# Patient Record
Sex: Female | Born: 1935 | Race: White | Hispanic: No | Smoking: Former smoker
Health system: Southern US, Community
[De-identification: ages and names within clinical notes are randomized; demographics above are authoritative.]

## PROBLEM LIST (undated history)

## (undated) DIAGNOSIS — I739 Peripheral vascular disease, unspecified: Secondary | ICD-10-CM

## (undated) DIAGNOSIS — C801 Malignant (primary) neoplasm, unspecified: Secondary | ICD-10-CM

## (undated) DIAGNOSIS — M1990 Unspecified osteoarthritis, unspecified site: Secondary | ICD-10-CM

## (undated) DIAGNOSIS — I509 Heart failure, unspecified: Secondary | ICD-10-CM

## (undated) DIAGNOSIS — M79606 Pain in leg, unspecified: Secondary | ICD-10-CM

## (undated) DIAGNOSIS — I2510 Atherosclerotic heart disease of native coronary artery without angina pectoris: Secondary | ICD-10-CM

## (undated) DIAGNOSIS — J449 Chronic obstructive pulmonary disease, unspecified: Secondary | ICD-10-CM

## (undated) DIAGNOSIS — I499 Cardiac arrhythmia, unspecified: Secondary | ICD-10-CM

## (undated) DIAGNOSIS — I219 Acute myocardial infarction, unspecified: Secondary | ICD-10-CM

## (undated) DIAGNOSIS — Z950 Presence of cardiac pacemaker: Secondary | ICD-10-CM

## (undated) HISTORY — DX: Cardiac arrhythmia, unspecified: I49.9

## (undated) HISTORY — PX: MELANOMA EXCISION: SHX5266

## (undated) HISTORY — PX: CHOLECYSTECTOMY: SHX55

## (undated) HISTORY — DX: Peripheral vascular disease, unspecified: I73.9

## (undated) HISTORY — DX: Malignant (primary) neoplasm, unspecified: C80.1

## (undated) HISTORY — DX: Chronic obstructive pulmonary disease, unspecified: J44.9

## (undated) HISTORY — DX: Unspecified osteoarthritis, unspecified site: M19.90

## (undated) HISTORY — DX: Heart failure, unspecified: I50.9

## (undated) HISTORY — PX: FINGER SURGERY: SHX640

## (undated) HISTORY — DX: Atherosclerotic heart disease of native coronary artery without angina pectoris: I25.10

## (undated) HISTORY — DX: Presence of cardiac pacemaker: Z95.0

## (undated) HISTORY — DX: Acute myocardial infarction, unspecified: I21.9

## (undated) HISTORY — DX: Pain in leg, unspecified: M79.606

---

## 2010-10-06 ENCOUNTER — Other Ambulatory Visit: Payer: Self-pay | Admitting: Lab

## 2010-10-06 DIAGNOSIS — M79609 Pain in unspecified limb: Secondary | ICD-10-CM

## 2010-10-29 ENCOUNTER — Encounter: Payer: Self-pay | Admitting: Vascular Surgery

## 2010-11-03 ENCOUNTER — Encounter: Payer: Self-pay | Admitting: Vascular Surgery

## 2010-11-04 ENCOUNTER — Encounter: Payer: Self-pay | Admitting: Vascular Surgery

## 2010-11-04 ENCOUNTER — Ambulatory Visit (INDEPENDENT_AMBULATORY_CARE_PROVIDER_SITE_OTHER): Payer: Medicare Other | Admitting: Vascular Surgery

## 2010-11-04 VITALS — BP 101/62 | HR 56 | Resp 20 | Ht 66.0 in | Wt 233.0 lb

## 2010-11-04 DIAGNOSIS — I739 Peripheral vascular disease, unspecified: Secondary | ICD-10-CM | POA: Insufficient documentation

## 2010-11-04 DIAGNOSIS — I70229 Atherosclerosis of native arteries of extremities with rest pain, unspecified extremity: Secondary | ICD-10-CM

## 2010-11-04 DIAGNOSIS — M79609 Pain in unspecified limb: Secondary | ICD-10-CM

## 2010-11-04 NOTE — Progress Notes (Signed)
[facility] [place] HISTORY AND PHYSICAL   CC: Referring Physician:  History of Present Illness:  Patient is an 77 year old female referred by Dr. Alona Bene for evaluation of a nonhealing ulceration of the foot. The patient stated that she discussed her toe approximately 2 months ago this will not heal. She has also developed heel ulcer in the last few weeks. She denies rest pain. She denies claudication symptoms. She is minimally ambulatory and really only transfers. She is in a wheelchair most of the time or ambulate somewhat with a walker. Chronic medical problems include diabetes, hypertension, elevated cholesterol, coronary disease, cardiomyopathy, congestive heart failure, COPD, severe osteoarthritis. These are all currently stable and followed by Dr. Ignacia Palma and her cardiologist in [place].   Past Medical History  Diagnosis Date  . Diabetes mellitus   . Cancer     Skin  . Leg pain   . COPD (chronic obstructive pulmonary disease)   . Cardiomyopathy     Past Surgical History  Procedure Date  . Cholecystectomy   . Melanoma excision   . Finger surgery     ROS: [x]  Positive   [ ]  Negative   [ ]  All sytems reviewed and are negative  General:[ ]  Weight loss, [ ]  Fever, [ ]  chills Neurologic: [ ]  Dizziness, [ ]  Blackouts, [ ]  Seizure [ ]  Stroke, [ ]  "Mini stroke", [ ]  Slurred speech, [ ]  Temporary blindness;  [ ] weakness,  [ ]  Hoarseness Cardiac: [ ]  Chest pain/pressure, [ ]  Shortness of breath at rest [x ] Shortness of breath with exertion,  [x ]  Atrial fibrillation or irregular heartbeat Vascular:[ ]  Pain in legs with walking, [ ]  Pain in legs at rest ,[ ]  Pain in legs at night,  [ ]   Non-healing ulcer, [ ]  Blood clot in vein/DVT,   Pulmonary: [x ] Home oxygen, [x ]  Productive cough, [ ]  Coughing up blood,  [ ]  Asthma,  [ ]  Wheezing Musculoskeletal:  [ ]  Arthritis, [ ]  Low back pain,  [ ]  Joint pain Hematologic:[ ]  Easy Bruising, [ ]  Anemia; [ ]   Hepatitis Gastrointestinal: [ ]  Blood in stool,  [ ]  Gastroesophageal Reflux, [ ]  Trouble swallowing Urinary: [ ]  chronic Kidney disease, [ ]  on HD - [ ]  MWF or [ ]  TTHS, [ ]  Burning with urination, [x ] Frequent urination, [ ]  Difficulty urinating;  Skin: [ ]  Rashes, [x ] Wounds Psychological: [ ]  Anxiety,  [ ]  Depression  Social History History  Substance Use Topics  . Smoking status: Former Smoker -- 55 years    Types: Cigarettes    Quit date: 11/04/1999  . Smokeless tobacco: Never Used  . Alcohol Use: No    Family History Family History  Problem Relation Age of Onset  . Diabetes Other   . COPD Mother   . Cancer Father   . Diabetes Sister     Allergies  Allergies  Allergen Reactions  . Ace Inhibitors   . Ciprofloxacin      Current Outpatient Prescriptions  Medication Sig Dispense Refill  . AMITRIPTYLINE HCL PO Take by mouth.        Marland Kitchen aspirin 325 MG EC tablet Take 325 mg by mouth daily.        . Calcium Carbonate-Vitamin D (OSCAL 500/200 D-3 PO) Take by mouth.        Marland Kitchen CARVEDILOL PO Take by mouth.        . CODEINE SULFATE PO Take by  mouth.        . DILTIAZEM HCL PO Take by mouth.        . ergocalciferol (VITAMIN D2) 50000 UNITS capsule Take 50,000 Units by mouth once a week.        . Ezetimibe (ZETIA PO) Take by mouth.        . Fenofibrate (TRICOR PO) Take by mouth.        . FENOFIBRATE PO Take by mouth.        . fish oil-omega-3 fatty acids 1000 MG capsule Take 2 g by mouth daily.        Marland Kitchen FOLIC ACID PO Take by mouth.        . Ginkgo Biloba (GINKGO PO) Take by mouth.        Marland Kitchen GLUCOSAMINE PO Take by mouth.        . GLYBURIDE PO Take by mouth.        . Ipratropium-Albuterol (DUONEB IN) Inhale into the lungs.        . Loratadine (CLARITIN PO) Take by mouth.        . METFORMIN HCL PO Take by mouth.        . Multiple Vitamin (MULTIVITAMIN) capsule Take 1 capsule by mouth daily.        Marland Kitchen NABUMETONE PO Take by mouth.        . Niacin, Antihyperlipidemic, (NIASPAN  PO) Take by mouth.        . Nitroglycerin (MINITRAN TD) Place onto the skin.        Marland Kitchen NITROGLYCERIN PO Take by mouth.        . Olmesartan Medoxomil (BENICAR PO) Take by mouth.        . propoxyphene-acetaminophen (DARVOCET A500) 100-500 MG per tablet Take 1 tablet by mouth every 6 (six) hours as needed.        . simvastatin (ZOCOR) 40 MG tablet Take 40 mg by mouth at bedtime.          Physical Examination  Filed Vitals:   11/04/10 1304  BP: 101/62  Pulse: 56  Resp: 20  Height: 5\' 6"  (1.676 m)  Weight: 233 lb (105.688 kg)    Body mass index is 37.61 kg/(m^2).  General:  Alert and oriented, no acute distress HEENT: Normal Neck: No bruit or JVD Pulmonary: Clear to auscultation bilaterally, distant breath sounds Cardiac: Regular Rate and Rhythm without murmur Gastrointestinal: Soft, non-tender, non-distended, no mass, no scars, obese Skin: No rash, several ulcerations left second toe, 1 cm ulcer pale base left heel Extremity Pulses:  2+ radial, brachial, femoral, dorsalis pedis right, posterior tibial pulses right, absent left pop pedal pulses Musculoskeletal: bilat valgus knee deformity  Neurologic: Upper and lower extremity motor 5/5 and symmetric  DATA:  She had bilateral ABIs performed today which I reviewed and interpreted. ABI on the right side was 0.81 left was 0.37  ASSESSMENT/Plan: Nonhealing ulceration left foot with limb threatening ischemia by ABI. The patient has multiple comorbidities and she is not a very good operative candidate overall. The best option at this point would be arteriogram lower extremity runoff possible angioplasty and stenting. If she is not a candidate for angioplasty and stenting, we will need to consider whether or not she would be an operative candidate and may also need further evaluation by her cardiologist. Risks benefits possible complications and procedure details were explained the patient and her daughter today they understand agree to proceed  her arteriogram will be tomorrow.

## 2010-11-04 NOTE — Progress Notes (Signed)
ABI performed [facility] 11/04/2010

## 2010-11-05 ENCOUNTER — Ambulatory Visit (HOSPITAL_COMMUNITY)
Admission: RE | Admit: 2010-11-05 | Discharge: 2010-11-05 | Disposition: A | Discharge status: Home / Self Care | Payer: Medicare Other | Source: Ambulatory Visit | Attending: Vascular Surgery | Admitting: Vascular Surgery

## 2010-11-05 DIAGNOSIS — I70219 Atherosclerosis of native arteries of extremities with intermittent claudication, unspecified extremity: Secondary | ICD-10-CM

## 2010-11-05 DIAGNOSIS — I708 Atherosclerosis of other arteries: Secondary | ICD-10-CM | POA: Insufficient documentation

## 2010-11-05 DIAGNOSIS — I70209 Unspecified atherosclerosis of native arteries of extremities, unspecified extremity: Secondary | ICD-10-CM | POA: Insufficient documentation

## 2010-11-05 DIAGNOSIS — Z01810 Encounter for preprocedural cardiovascular examination: Secondary | ICD-10-CM | POA: Insufficient documentation

## 2010-11-05 HISTORY — PX: OTHER SURGICAL HISTORY: SHX169

## 2010-11-05 LAB — POCT I-STAT, CHEM 8
BUN: 41 mg/dL — ABNORMAL HIGH (ref 6–23)
Calcium, Ion: 1.28 mmol/L (ref 1.12–1.32)
HCT: 39 % (ref 36.0–46.0)
Sodium: 139 mEq/L (ref 135–145)

## 2010-11-05 LAB — GLUCOSE, CAPILLARY
Glucose-Capillary: 150 mg/dL — ABNORMAL HIGH (ref 70–99)
Glucose-Capillary: 113 mg/dL — ABNORMAL HIGH (ref 70–99)

## 2010-11-05 LAB — POCT ACTIVATED CLOTTING TIME: Activated Clotting Time: 160 seconds

## 2010-11-09 LAB — POCT ACTIVATED CLOTTING TIME: Activated Clotting Time: 188 seconds

## 2010-11-10 NOTE — Op Note (Signed)
Julia Goodman, Julia Goodman                   ACCOUNT NO.:  [id number]  MEDICAL RECORD NO.:  [id number]  LOCATION:  [facility]                         FACILITY:  [facility]  PHYSICIAN:  Janetta Hora. Fields, MD  DATE OF BIRTH:  [birth year]  DATE OF PROCEDURE:  11/05/2010 DATE OF DISCHARGE:  11/05/2010                              OPERATIVE REPORT   PROCEDURE: 1. Aortogram with left lower extremity runoff. 2. Left common iliac stent.  OPERATIVE DETAILS:  After obtaining informed consent, the patient was taken to the operating room.  The patient was placed supine position on angio table.  Both groins were prepped and draped in usual sterile fashion.  Local anesthesia was infiltrated via the right common femoral artery.  Ultrasound was used to identify the femoral bifurcation. Fluoroscopy was also used to identify the femoral head.  Local anesthesia was infiltrated over the area.  The common femoral artery and an introducer needle was used to cannulate the right common femoral artery using ultrasound and fluoroscopic guidance.  The cannulation required a few needle punctures primarily due to the patient's severe obesity.  After cannulation of the right common femoral artery, a 0.035 Versacore wire was threaded up in the abdominal aorta under fluoroscopic guidance.  A 5-French sheath was then placed over the guidewire in the right common femoral artery.  This was thoroughly flushed with heparinized saline.  A 5-French pigtail catheter was then placed over the guidewire and the abdominal aorta and abdominal aortogram was obtained.  This shows bilateral single renal arteries which were patent. The infrarenal abdominal aorta has moderate atherosclerotic change but no focal narrowing.  The right common iliac artery was patent.  The left common iliac artery has a high-grade greater than 90% stenosis in its mid segment.  The left internal iliac artery was patent.  The right internal iliac artery was occluded.  The  right external iliac artery and left external iliac arteries were both patent.  An oblique view of the pelvis was also performed to confirm the left common iliac artery stenosis.  This again confirmed that the right and left external iliac arteries were patent.  However, on the oblique view the right internal iliac artery was visualized so that the left and right internal iliac arteries were patent.  At this point, it was decided to intervene on the left iliac stenosis since the patient's left leg was a symptomatic side.  Local anesthesia was infiltrated over the left common femoral artery and an introducer needle was used to cannulate the left common femoral artery again using fluoroscopic and ultrasound guidance.  An 0.035 Versacore wire was threaded up into the left iliac artery up to the level of the subtotal stenosis.  I then attempted to place a 7-French long bright tip sheath over this.  However, again due to the patient's obesity, the sheath was kinking in the subcutaneous tissues.  Therefore, a 5-French dilator was placed over the guidewire into the left iliac system and the Versacore wire exchanged for an 0.035 Amplatz wire.  I was then able to exchange the 5-French dilator for the 7-French bright tip sheath over the Amplatz wire.  An 0.035 angled Glidewire  was then brought up in the operative field as well as a 5-French Kumpe catheter.  The Kumpe catheter was advanced over the guidewire into the left common iliac artery and using the Kumpe catheter and the Glidewire, I was able to advance the Glidewire across the lesion.  The patient was given 10,000 units of heparin as we were doing this portion of the procedure.  She was given initially a bolus of 7000 units of heparin.  An ACT was checked and found to be just under 200, so the patient was given an additional 3000 units of heparin for total dose of 10,000 units of heparin.  1. At this point, the 5-French Kumpe catheter was  advanced across the     stenosis over the angled Glidewire.  The angled Glidewire was then     removed and exchanged back for the 0.035 Amplatz wire.  I then     proceeded over the Amplatz wire to predilate the 90% stenosis in     the left common iliac artery.  This was done with a 6 x 20     angioplasty balloon and this was inflated to 8 atmospheres of 60     seconds.  The angioplasty balloon was then deflated, pulled back     down again using angiographic and roadmapping guidance as well as     bony landmarks.  A 9 x 59 balloon expandable stent was then brought     up in the operative field and advanced up to the level of the     stenosis.  This was then deployed at the stenosis to 14 atmospheres     at 40 seconds.  Completion arteriogram showed that there was some     poststenotic dilatation of the distal iliac, so in order to have     good stent apposition to the wall distally a 10 x 40 angioplasty     balloon was brought up in the operative field and the distal     segment of the stent was angioplastied to 7 atmospheres to 60     seconds to mold the distal portion of the stent to the wall of the     artery so that there was good wall apposition in the proximal and     distal aspects of the stent.  A completion arteriogram was then     obtained which showed no dissection, extravasation, and good apical     wall apposition of the stent.  At this point, the pigtail catheter     was removed from the right side and a left lower extremity     arteriogram was obtained through the 7-French sheath.  An oblique     view of the left groin was obtained which shows a subtotal     occlusion of the left common femoral artery just above the femoral     bifurcation.  AP view of the runoff then shows a left superficial     femoral artery which has 2 calcified stenoses in its mid segment,     both approximately 50%.  There was a diffuse tapered stenosis of     the distal superficial femoral artery.   Approximately 50% the     popliteal artery was patent above-the-knee but then occludes at the     knee joint.  The tibioperoneal trunk was occluded.  The anterior     tibial artery is in continuity all the way down to the level of the  dorsalis pedis artery.  The peroneal artery is occluded throughout     its course.  The posterior tibial artery was small but does     reconstitute via geniculate collaterals also and continues all the     way to the level of the foot.  Due to the common femoral artery stenosis, the patient was not a candidate for percutaneous intervention of the more distal tibial disease and I did not believe that she was a reasonable candidate.  We tried to recannulize these tibial vessels anyway due to the length of the stenosis.  At this point, the guidewires were all removed and the sheath was thoroughly flushed with heparinized saline to be pulled in the holding area after the patient's ACT was less than 175.  The patient tolerated the procedure well and there were no complications.  The patient was taken to the holding area in stable condition.  OPERATIVE FINDINGS: 1. Greater than 90% left common iliac artery stenosis successfully     stented to 0% stenosis with a 9 x 59 balloon expandable stent. 2. Greater than 90% stenosis, distal left common femoral artery. 3. Multilevel 50% stenosis, left superficial femoral artery. 4. Occlusion of below-knee popliteal artery with reconstitution of the     anterior tibial and posterior tibial arteries.  In light of the arteriographic findings, the patient is not a candidate for more distal peripheral vascular intervention due to the common femoral lesion as well as the length of the lesion in the tibial vessels.  The patient is also not a candidate for a tibial artery bypass overall to her multiple comorbidities, age, and the fact that she is marginally ambulatory at this point.  She currently has an ulceration on her  second toe and on her heel and hopefully these will have reasonable perfusion after treating her iliac stenosis that maybe these will heal spontaneously.  If the wounds continued to deteriorate over time, if the patient develops unrelenting rest pain or serious infection in the foot, then she would need a left above-knee amputation.  The patient will continue to follow up with Dr. Alona Bene for her wound care issues.  If the above mentioned problems happens, she will be referred back to me for possible consideration for an above-knee amputation.  All these findings were discussed with the patient and her family today.     Janetta Hora. Fields, MD     CEF/MEDQ  D:  11/05/2010  T:  11/05/2010  Job:  [id number]  cc:   Jessie Foot  Electronically Signed by Fabienne Bruns MD on 11/10/2010 11:42:08 AM

## 2010-11-17 ENCOUNTER — Encounter: Payer: Self-pay | Admitting: Vascular Surgery

## 2010-11-18 ENCOUNTER — Ambulatory Visit (INDEPENDENT_AMBULATORY_CARE_PROVIDER_SITE_OTHER): Payer: Medicare Other | Admitting: Vascular Surgery

## 2010-11-18 ENCOUNTER — Encounter: Payer: Self-pay | Admitting: Vascular Surgery

## 2010-11-18 VITALS — BP 129/64 | HR 67 | Temp 97.7°F | Resp 24 | Ht 66.0 in | Wt 233.0 lb

## 2010-11-18 DIAGNOSIS — I70219 Atherosclerosis of native arteries of extremities with intermittent claudication, unspecified extremity: Secondary | ICD-10-CM

## 2010-11-18 NOTE — Progress Notes (Signed)
[facility] [place] HISTORY AND PHYSICAL   History of Present Illness:  Patient is a 77 y.o. year old female who presents for follow-up evaluation for PAD with a left foot ulcer.  She is on Aspirin for antiplatelet therapy.  She underwent left common iliac stenting without difficulty but has residual lower extremity occlusions that were not treated to her being a poor operative candidate.  Her atherosclerotic risk factors remain diabetes, elevated cholesterol, hypertension, and coronary artery disease.  These are all currently stabled and followed by his primary care physician.  She is treating the wounds on her toe and heel with aquacel with some improvement.  Past Medical History  Diagnosis Date  . Diabetes mellitus   . Cancer     Skin  . Leg pain   . COPD (chronic obstructive pulmonary disease)   . Cardiomyopathy     Past Surgical History  Procedure Date  . Cholecystectomy   . Melanoma excision   . Finger surgery   . Aortogram with stenting 11-05-10    Left CIA stent    Review of Systems:  Cardiac:denies shortness of breath or chest pain Pulmonary: denies cough or wheeze  Social History History  Substance Use Topics  . Smoking status: Former Smoker -- 55 years    Types: Cigarettes    Quit date: 11/04/1999  . Smokeless tobacco: Never Used  . Alcohol Use: No    Allergies  Allergies  Allergen Reactions  . Ace Inhibitors   . Ciprofloxacin   . Niaspan (Niacin (Antihyperlipidemic))   . Zetia (Ezetimibe)      Current Outpatient Prescriptions  Medication Sig Dispense Refill  . acetaminophen-codeine (TYLENOL #3) 300-30 MG per tablet Take 1 tablet by mouth every 4 (four) hours as needed.        Marland Kitchen AMITRIPTYLINE HCL PO Take 25 mg by mouth daily.       Marland Kitchen aspirin 325 MG EC tablet Take 325 mg by mouth daily.        . Calcium Carbonate-Vitamin D (OSCAL 500/200 D-3 PO) Take by mouth.        Marland Kitchen CARVEDILOL PO Take 6.25 mg by mouth daily.       Marland Kitchen DILTIAZEM  HCL PO Take 120 mg by mouth daily.       . ergocalciferol (VITAMIN D2) 50000 UNITS capsule Take 50,000 Units by mouth once a week.        . Fenofibrate (TRICOR PO) Take 160 mg by mouth daily.       . fish oil-omega-3 fatty acids 1000 MG capsule Take 2 g by mouth daily.        Marland Kitchen FOLIC ACID PO Take by mouth.        . Ginkgo Biloba (GINKGO PO) Take by mouth.        Marland Kitchen GLUCOSAMINE PO Take by mouth.        . GLYBURIDE PO Take 5 mg by mouth.       . Ipratropium-Albuterol (DUONEB IN) Inhale into the lungs.        . Loratadine (CLARITIN PO) Take by mouth.        . METFORMIN HCL PO Take by mouth.        . Multiple Vitamin (MULTIVITAMIN) capsule Take 1 capsule by mouth daily.        Marland Kitchen NABUMETONE PO Take by mouth.        . Nitroglycerin (MINITRAN TD) Place onto the skin.        Marland Kitchen  Olmesartan Medoxomil (BENICAR PO) Take 12.5 mg by mouth.       . simvastatin (ZOCOR) 40 MG tablet Take 40 mg by mouth at bedtime.          Physical Examination  Filed Vitals:   11/18/10 0901  BP: 129/64  Pulse: 67  Temp: 97.7 F (36.5 C)  TempSrc: Oral  Resp: 24  Height: 5\' 6"  (1.676 m)  Weight: 233 lb (105.688 kg)  SpO2: 96%    Body mass index is 37.61 kg/(m^2).  General:  Alert and oriented, no acute distress Extremities: 2+ femoral pulse left side, left foot pink, dry ulcers left second toe, 2 cm left heel crack overall improved   ASSESSMENT: Patient with severe left lower extremity arterial occlusive disease. Her left common iliac stenosis has been treated. The left common iliac stent should improve her overall perfusion to her left lower extremity. However, she has residual tibial and common femoral disease on the left side. Hopefully she will have reasonable perfusion to heal the left foot. If her wound progressed over time her only option would be a left above-knee amputation.   PLAN: She will continue followup with Dr. Linna Caprice.  She will return for ABIs in 6 months.  She will continue her  aspirin.  Fabienne Bruns, MD [facility] [place] Office: [phone] Pager: [phone]

## 2010-11-18 NOTE — Progress Notes (Signed)
Addended by: Sharee Pimple on: 11/18/2010 09:50 AM   Modules accepted: Orders

## 2011-03-11 HISTORY — PX: FRACTURE SURGERY: SHX138

## 2011-05-18 ENCOUNTER — Encounter: Payer: Self-pay | Admitting: Neurosurgery

## 2011-05-19 ENCOUNTER — Encounter: Payer: Self-pay | Admitting: Neurosurgery

## 2011-05-19 ENCOUNTER — Ambulatory Visit (INDEPENDENT_AMBULATORY_CARE_PROVIDER_SITE_OTHER): Payer: Medicare Other | Admitting: Neurosurgery

## 2011-05-19 ENCOUNTER — Encounter (INDEPENDENT_AMBULATORY_CARE_PROVIDER_SITE_OTHER): Payer: Medicare Other | Admitting: *Deleted

## 2011-05-19 VITALS — BP 113/72 | HR 68 | Resp 16 | Ht 66.0 in | Wt 232.0 lb

## 2011-05-19 DIAGNOSIS — I70219 Atherosclerosis of native arteries of extremities with intermittent claudication, unspecified extremity: Secondary | ICD-10-CM

## 2011-05-19 DIAGNOSIS — I739 Peripheral vascular disease, unspecified: Secondary | ICD-10-CM

## 2011-05-19 DIAGNOSIS — Z48812 Encounter for surgical aftercare following surgery on the circulatory system: Secondary | ICD-10-CM

## 2011-05-19 NOTE — Progress Notes (Signed)
Addended by: Sharee Pimple on: 05/19/2011 03:18 PM   Modules accepted: Orders

## 2011-05-19 NOTE — Progress Notes (Signed)
[facility] [place] HISTORY AND PHYSICAL   CC: Daniel lower extremity ABI status post a left CIA stent September 2012 Referring Physician: Darrick Penna  History of Present Illness: This is a 78 year old female patient of Dr. Darrick Penna that is now almost 7 months status post left CIA stent to the patient is seen with her daughter who states her left lower extremity ulcers have healed. Her left foot has healed. She has no new medical problems other than a fractured coccyx.  Past Medical History  Diagnosis Date  . Diabetes mellitus   . Leg pain   . COPD (chronic obstructive pulmonary disease)   . Cardiomyopathy   . Irregular heartbeat   . Coronary artery disease   . Myocardial infarction   . Peripheral vascular disease   . Cancer     Skin Melanoma    ROS: [x]  Positive   [ ]  Denies    General: [ ]  Weight loss, [ ]  Fever, [ ]  chills Neurologic: [ ]  Dizziness, [ ]  Blackouts, [ ]  Seizure [ ]  Stroke, [ ]  "Mini stroke", [ ]  Slurred speech, [ ]  Temporary blindness; [ ]  weakness in arms or legs, [ ]  Hoarseness Cardiac: [ ]  Chest pain/pressure, [ ]  Shortness of breath at rest [ ]  Shortness of breath with exertion, [ ]  Atrial fibrillation or irregular heartbeat Vascular: [ ]  Pain in legs with walking, [ ]  Pain in legs at rest, [ ]  Pain in legs at night,  [ ]  Non-healing ulcer, [ ]  Blood clot in vein/DVT,   Pulmonary: [ ]  Home oxygen, [ ]  Productive cough, [ ]  Coughing up blood, [ ]  Asthma,  [ ]  Wheezing Musculoskeletal:  [ ]  Arthritis, [ ]  Low back pain, [ ]  Joint pain Hematologic: [ ]  Easy Bruising, [ ]  Anemia; [ ]  Hepatitis Gastrointestinal: [ ]  Blood in stool, [ ]  Gastroesophageal Reflux/heartburn, [ ]  Trouble swallowing Urinary: [ ]  chronic Kidney disease, [ ]  on HD - [ ]  MWF or [ ]  TTHS, [ ]  Burning with urination, [ ]  Difficulty urinating Skin: [ ]  Rashes, [ ]  Wounds Psychological: [ ]  Anxiety, [ ]  Depression   Social History History  Substance Use Topics  . Smoking  status: Former Smoker -- 55 years    Types: Cigarettes    Quit date: 11/04/1999  . Smokeless tobacco: Never Used  . Alcohol Use: No    Family History Family History  Problem Relation Age of Onset  . Diabetes Other   . COPD Mother   . Cancer Father   . Diabetes Sister   . Hyperlipidemia Daughter   . Hypertension Daughter   . Hyperlipidemia Son   . Hypertension Son     Allergies  Allergen Reactions  . Hydrocodone Anaphylaxis  . Ace Inhibitors   . Ciprofloxacin   . Niaspan (Niacin (Antihyperlipidemic))   . Zetia (Ezetimibe)     Current Outpatient Prescriptions  Medication Sig Dispense Refill  . acetaminophen-codeine (TYLENOL #3) 300-30 MG per tablet Take 1 tablet by mouth every 4 (four) hours as needed.        Marland Kitchen AMITRIPTYLINE HCL PO Take 25 mg by mouth daily.       Marland Kitchen aspirin 325 MG EC tablet Take 325 mg by mouth daily.        . Calcium Carbonate-Vitamin D (OSCAL 500/200 D-3 PO) Take by mouth.        Marland Kitchen CARVEDILOL PO Take 6.25 mg by mouth daily.       Marland Kitchen DILTIAZEM  HCL PO Take 120 mg by mouth daily.       . ergocalciferol (VITAMIN D2) 50000 UNITS capsule Take 50,000 Units by mouth once a week.        . Fenofibrate (TRICOR PO) Take 160 mg by mouth daily.       . fish oil-omega-3 fatty acids 1000 MG capsule Take 2 g by mouth daily.        Marland Kitchen FOLIC ACID PO Take by mouth.        . Ginkgo Biloba (GINKGO PO) Take by mouth.        Marland Kitchen GLUCOSAMINE PO Take by mouth.        . GLYBURIDE PO Take 5 mg by mouth.       . Ipratropium-Albuterol (DUONEB IN) Inhale into the lungs.        . Loratadine (CLARITIN PO) Take by mouth.        . METFORMIN HCL PO Take by mouth.        . Multiple Vitamin (MULTIVITAMIN) capsule Take 1 capsule by mouth daily.        Marland Kitchen NABUMETONE PO Take by mouth.        . Nitroglycerin (MINITRAN TD) Place onto the skin.        . Olmesartan Medoxomil (BENICAR PO) Take 12.5 mg by mouth.       . pravastatin (PRAVACHOL) 80 MG tablet Take 80 mg by mouth at bedtime.      .  simvastatin (ZOCOR) 40 MG tablet Take 40 mg by mouth at bedtime.          Physical Examination  Filed Vitals:   05/19/11 0947  BP: 113/72  Pulse: 68  Resp: 16    Body mass index is 37.45 kg/(m^2).  General:  WDWN in NAD Gait: Normal HEENT: WNL Eyes: Pupils equal Pulmonary: normal non-labored breathing , without Rales, rhonchi,  wheezing Cardiac: RRR, without  Murmurs, rubs or gallops; No carotid bruits Abdomen: soft, NT, no masses Skin: no rashes, ulcers noted Vascular Exam/Pulses: She has 2+ radial pulses bilaterally, palpable femoral pulses bilaterally, she has a 2+ DP and PT on the right, DP and PT are heard with Doppler on the left  Extremities without ischemic changes, no Gangrene , no cellulitis; no open wounds;  Musculoskeletal: no muscle wasting or atrophy  Neurologic: A&O X 3; Appropriate Affect ; SENSATION: normal; MOTOR FUNCTION:  moving all extremities equally. Speech is fluent/normal  Non-Invasive Vascular Imaging: An ABI today is 0.92 on the right 0.62 on the left  ASSESSMENT/PLAN: I discussed the ABIs with Dr. Darrick Penna who is pleased with the patient's results thus far, the patient may return for repeat ABIs in one year, she and her daughter's questions were encouraged and answered.  Lauree Chandler ANP  Clinic M.D.: [place]

## 2012-05-24 ENCOUNTER — Ambulatory Visit: Payer: Medicare Other | Admitting: Neurosurgery

## 2012-05-24 ENCOUNTER — Encounter (INDEPENDENT_AMBULATORY_CARE_PROVIDER_SITE_OTHER): Payer: Medicare Other | Admitting: *Deleted

## 2012-05-24 DIAGNOSIS — I70219 Atherosclerosis of native arteries of extremities with intermittent claudication, unspecified extremity: Secondary | ICD-10-CM

## 2012-05-28 ENCOUNTER — Other Ambulatory Visit: Payer: Self-pay | Admitting: *Deleted

## 2012-05-28 ENCOUNTER — Encounter: Payer: Self-pay | Admitting: Vascular Surgery

## 2012-05-28 DIAGNOSIS — I70219 Atherosclerosis of native arteries of extremities with intermittent claudication, unspecified extremity: Secondary | ICD-10-CM

## 2013-05-29 ENCOUNTER — Encounter: Payer: Self-pay | Admitting: Vascular Surgery

## 2013-05-30 ENCOUNTER — Ambulatory Visit (INDEPENDENT_AMBULATORY_CARE_PROVIDER_SITE_OTHER): Payer: Medicare Other | Admitting: Vascular Surgery

## 2013-05-30 ENCOUNTER — Ambulatory Visit (HOSPITAL_COMMUNITY)
Admission: RE | Admit: 2013-05-30 | Discharge: 2013-05-30 | Disposition: A | Discharge status: Home / Self Care | Payer: Medicare Other | Source: Ambulatory Visit | Attending: Vascular Surgery | Admitting: Vascular Surgery

## 2013-05-30 ENCOUNTER — Encounter: Payer: Self-pay | Admitting: Vascular Surgery

## 2013-05-30 VITALS — BP 99/59 | HR 46 | Resp 16 | Ht 65.0 in | Wt 212.0 lb

## 2013-05-30 DIAGNOSIS — I70219 Atherosclerosis of native arteries of extremities with intermittent claudication, unspecified extremity: Secondary | ICD-10-CM

## 2013-05-30 NOTE — Progress Notes (Signed)
[facility] [place] HISTORY AND PHYSICAL   CC:  Non healing ulcer  Julia Cedars, MD  HPI: This is a 80 y.o. female who presents today for non healing ulcers on her left foot.  She states that she has a desk chair that the legs stick out and she has "clumbsy" feet and bumps into the chair.  She now has an ulcer over the achilles and lateral malleolus.  She denies any new problems.  Past Medical History  Diagnosis Date  . Diabetes mellitus   . Leg pain   . COPD (chronic obstructive pulmonary disease)   . Cardiomyopathy   . Irregular heartbeat   . Coronary artery disease   . Myocardial infarction   . Peripheral vascular disease   . Cancer     Skin Melanoma  . CHF (congestive heart failure)   . Arthritis     OSTEOARTHRITIS   Past Surgical History  Procedure Laterality Date  . Melanoma excision    . Finger surgery    . Aortogram with stenting  11-05-10    Left CIA stent  . Fracture surgery  Feb. 2013    Spine  . Cholecystectomy      Gall Bladder    Allergies  Allergen Reactions  . Hydrocodone Anaphylaxis  . Ace Inhibitors   . Ciprofloxacin   . Niaspan [Niacin Er]   . Zetia [Ezetimibe]     Current Outpatient Prescriptions  Medication Sig Dispense Refill  . acetaminophen-codeine (TYLENOL #3) 300-30 MG per tablet Take 1 tablet by mouth every 4 (four) hours as needed.        Marland Kitchen AMITRIPTYLINE HCL PO Take 25 mg by mouth daily.       Marland Kitchen apixaban (ELIQUIS) 2.5 MG TABS tablet Take 2.5 mg by mouth 2 (two) times daily.      . Calcium Carbonate-Vitamin D (OSCAL 500/200 D-3 PO) Take by mouth.        . ergocalciferol (VITAMIN D2) 50000 UNITS capsule Take 50,000 Units by mouth once a week.        . Fenofibrate (TRICOR PO) Take 160 mg by mouth daily.       . fish oil-omega-3 fatty acids 1000 MG capsule Take 2 g by mouth daily.        Marland Kitchen FOLIC ACID PO Take by mouth.        . FUROSEMIDE PO Take by mouth daily.      . Ginkgo Biloba (GINKGO PO) Take by mouth.         Marland Kitchen glipiZIDE (GLUCOTROL) 5 MG tablet Take 5 mg by mouth 2 (two) times daily before a meal. Takes 2 tablets twice daily.      Marland Kitchen GLUCOSAMINE PO Take by mouth.        . METFORMIN HCL PO Take by mouth.        . Multiple Vitamin (MULTIVITAMIN) capsule Take 1 capsule by mouth daily.        Marland Kitchen NABUMETONE PO Take by mouth.        . Nitroglycerin (MINITRAN TD) Place onto the skin.        . Olmesartan Medoxomil (BENICAR PO) Take 12.5 mg by mouth.       . pravastatin (PRAVACHOL) 80 MG tablet Take 80 mg by mouth at bedtime.      . sotalol (BETAPACE) 80 MG tablet Take 80 mg by mouth 2 (two) times daily.      Marland Kitchen aspirin 325 MG EC tablet Take 325 mg  by mouth daily.        Marland Kitchen CARVEDILOL PO Take 6.25 mg by mouth daily.       Marland Kitchen DILTIAZEM HCL PO Take 120 mg by mouth daily.       . GLYBURIDE PO Take 5 mg by mouth.       . Ipratropium-Albuterol (DUONEB IN) Inhale into the lungs.        . Loratadine (CLARITIN PO) Take by mouth.        . simvastatin (ZOCOR) 40 MG tablet Take 40 mg by mouth at bedtime.         No current facility-administered medications for this visit.    Family History  Problem Relation Age of Onset  . Diabetes Other   . COPD Mother   . Cancer Father   . Diabetes Sister   . Hyperlipidemia Daughter   . Hypertension Daughter   . Hyperlipidemia Son   . Hypertension Son     History   Social History  . Marital Status: Widowed    Spouse Name: N/A    Number of Children: N/A  . Years of Education: N/A   Occupational History  . Not on file.   Social History Main Topics  . Smoking status: Former Smoker -- 32 years    Types: Cigarettes    Quit date: 11/04/1999  . Smokeless tobacco: Never Used  . Alcohol Use: No  . Drug Use: No  . Sexual Activity: Not on file   Other Topics Concern  . Not on file   Social History Narrative  . No narrative on file     ROS: [x]  Positive   [ ]  Negative   [ ]  All sytems reviewed and are negative  Cardiovascular: []  chest pain/pressure []   palpitations []  SOB lying flat [x]  DOE-COPD [X]  pain in legs while walking-Osteoarthritis  []  pain in feet when lying flat []  hx of DVT []  hx of phlebitis []  swelling in legs []  varicose veins  Pulmonary: []  productive cough []  asthma []  wheezing  Neurologic: [x]  weakness in []  arms [x]  legs [x]  numbness in []  arms [x]  legs [] difficulty speaking or slurred speech []  temporary loss of vision in one eye []  dizziness  Hematologic: []  bleeding problems []  problems with blood clotting easily  GI []  vomiting blood []  blood in stool  GU: []  burning with urination []  blood in urine  Psychiatric: []  hx of major depression  Integumentary: []  rashes []  ulcers  Constitutional: []  fever []  chills   PHYSICAL EXAMINATION:  Filed Vitals:   05/30/13 1354  BP: 99/59  Pulse: 46  Resp: 16   Body mass index is 35.28 kg/(m^2).  General:  WDWN in NAD Gait: unsteady HENT: WNL, normocephalic Eyes: Pupils equal Pulmonary: normal non-labored breathing , without Rales, rhonchi,  wheezing Cardiac: RRR, without  Murmurs, rubs or gallops;  Abdomen: soft, NT, no masses Skin: without rashes, with ulcers  Vascular Exam/Pulses:1+ bilateral femoral pulses Extremities: without ischemic changes, without Gangrene , without cellulitis; with open wounds;  21mm ulcer at the achilles 58mm ulcer on the lateral malleolus Distal pulses are not palplable  Musculoskeletal: no muscle wasting or atrophy  Neurologic: A&O X 3; Appropriate Affect ; SENSATION: normal; MOTOR FUNCTION:  moving all extremities equally. Speech is fluent/normal   Non-Invasive Vascular Imaging:  ABI's 05/30/13 Right:  0.64 Left:  0.47  Pt meds includes: Statin:  yes Beta Blocker:  yes Aspirin:  yes ACEI:  no ARB:  no Other Antiplatelet/Anticoagulant:  yes-Eliquis  ASSESSMENT: 80 y.o. female with new ulcers on left achilles and left lateral malleolus.  Pt and family member state these have significantly  improved.  PLAN: -will have pt to continue to wash daily with soap and water and then apply silvadene and bandage.  She will f/u with Dr. Oneida Alar in 3-4 weeks for a wound check.   -She is advised to put padding on the chair legs or do away with the chair as her foot bumps the feet of the chair and she is at high risk for non healing wounds and subsequent amputation.    Leontine Locket, PA-C [facility] [phone]  Clinic MD:  Pt seen and examined in conjunction with Dr. Oneida Alar  Multiple ulcerations Left leg all less than 1 mm in depth. The patient reports that these are slowly healing. ABIs today were 0.47 on the left 0.64 on the right with monophasic waveforms. She does have a palpable femoral pulse suggesting that her left common iliac stent is patent  She will followup in a few weeks and if the wound on her foot have not healed spontaneously consider arteriogram at that point. She overall is fairly debilitated and not a very good revascularization candidate.  Ruta Hinds, MD [facility] [place] Office: [phone] Pager: [phone]

## 2013-06-27 ENCOUNTER — Ambulatory Visit: Payer: Medicare Other | Admitting: Vascular Surgery

## 2013-07-10 ENCOUNTER — Encounter: Payer: Self-pay | Admitting: Vascular Surgery

## 2013-07-11 ENCOUNTER — Encounter: Payer: Self-pay | Admitting: Vascular Surgery

## 2013-07-11 ENCOUNTER — Ambulatory Visit (INDEPENDENT_AMBULATORY_CARE_PROVIDER_SITE_OTHER): Payer: Medicare Other | Admitting: Vascular Surgery

## 2013-07-11 VITALS — BP 119/76 | HR 53 | Ht 65.0 in | Wt 212.0 lb

## 2013-07-11 DIAGNOSIS — I739 Peripheral vascular disease, unspecified: Secondary | ICD-10-CM

## 2013-07-11 DIAGNOSIS — L98499 Non-pressure chronic ulcer of skin of other sites with unspecified severity: Principal | ICD-10-CM

## 2013-07-11 DIAGNOSIS — I7025 Atherosclerosis of native arteries of other extremities with ulceration: Secondary | ICD-10-CM | POA: Insufficient documentation

## 2013-07-11 NOTE — Progress Notes (Signed)
[facility] [place] HISTORY AND PHYSICAL   CC:  Non healing ulcer   Netta Cedars, MD  HPI: This is a 80 y.o. female who presents today for non healing ulcers on her left foot.  she was last seen one month ago. She states that she has a desk chair that the legs stick out and she has "clumbsy" feet and bumps into the chair.  she states she also occasionally bumps her foot against her walker. She now has an ulcer over the lateral malleolus and dorsal to.  She denies any new problems.    Past Medical History   Diagnosis  Date   .  Diabetes mellitus     .  Leg pain     .  COPD (chronic obstructive pulmonary disease)     .  Cardiomyopathy     .  Irregular heartbeat     .  Coronary artery disease     .  Myocardial infarction     .  Peripheral vascular disease     .  Cancer         Skin Melanoma   .  CHF (congestive heart failure)     .  Arthritis         OSTEOARTHRITIS      Past Surgical History   Procedure  Laterality  Date   .  Melanoma excision       .  Finger surgery       .  Aortogram with stenting    11-05-10       Left CIA stent   .  Fracture surgery    Feb. 2013       Spine   .  Cholecystectomy           Gall Bladder       Allergies   Allergen  Reactions   .  Hydrocodone  Anaphylaxis   .  Ace Inhibitors     .  Ciprofloxacin     .  Niaspan [Niacin Er]     .  Zetia [Ezetimibe]         Current Outpatient Prescriptions   Medication  Sig  Dispense  Refill   .  acetaminophen-codeine (TYLENOL #3) 300-30 MG per tablet  Take 1 tablet by mouth every 4 (four) hours as needed.           Marland Kitchen  AMITRIPTYLINE HCL PO  Take 25 mg by mouth daily.          Marland Kitchen  apixaban (ELIQUIS) 2.5 MG TABS tablet  Take 2.5 mg by mouth 2 (two) times daily.         .  Calcium Carbonate-Vitamin D (OSCAL 500/200 D-3 PO)  Take by mouth.           .  ergocalciferol (VITAMIN D2) 50000 UNITS capsule  Take 50,000 Units by mouth once a week.           .  Fenofibrate (TRICOR PO)  Take 160  mg by mouth daily.          .  fish oil-omega-3 fatty acids 1000 MG capsule  Take 2 g by mouth daily.           Marland Kitchen  FOLIC ACID PO  Take by mouth.           .  FUROSEMIDE PO  Take by mouth daily.         .  Ginkgo Biloba (GINKGO PO)  Take  by mouth.           Marland Kitchen  glipiZIDE (GLUCOTROL) 5 MG tablet  Take 5 mg by mouth 2 (two) times daily before a meal. Takes 2 tablets twice daily.         Marland Kitchen  GLUCOSAMINE PO  Take by mouth.           .  METFORMIN HCL PO  Take by mouth.           .  Multiple Vitamin (MULTIVITAMIN) capsule  Take 1 capsule by mouth daily.           Marland Kitchen  NABUMETONE PO  Take by mouth.           .  Nitroglycerin (MINITRAN TD)  Place onto the skin.           .  Olmesartan Medoxomil (BENICAR PO)  Take 12.5 mg by mouth.          .  pravastatin (PRAVACHOL) 80 MG tablet  Take 80 mg by mouth at bedtime.         .  sotalol (BETAPACE) 80 MG tablet  Take 80 mg by mouth 2 (two) times daily.         Marland Kitchen  aspirin 325 MG EC tablet  Take 325 mg by mouth daily.           Marland Kitchen  CARVEDILOL PO  Take 6.25 mg by mouth daily.          Marland Kitchen  DILTIAZEM HCL PO  Take 120 mg by mouth daily.          .  GLYBURIDE PO  Take 5 mg by mouth.          .  Ipratropium-Albuterol (DUONEB IN)  Inhale into the lungs.           .  Loratadine (CLARITIN PO)  Take by mouth.           .  simvastatin (ZOCOR) 40 MG tablet  Take 40 mg by mouth at bedtime.              No current facility-administered medications for this visit.       Family History   Problem  Relation  Age of Onset   .  Diabetes  Other     .  COPD  Mother     .  Cancer  Father     .  Diabetes  Sister     .  Hyperlipidemia  Daughter     .  Hypertension  Daughter     .  Hyperlipidemia  Son     .  Hypertension  Son         History      Social History   .  Marital Status:  Widowed       Spouse Name:  N/A       Number of Children:  N/A   .  Years of Education:  N/A      Occupational History   .  Not on file.      Social History Main Topics   .  Smoking  status:  Former Smoker -- 49 years       Types:  Cigarettes       Quit date:  11/04/1999   .  Smokeless tobacco:  Never Used   .  Alcohol Use:  No   .  Drug Use:  No   .  Sexual Activity:  Not on  file      Other Topics  Concern   .  Not on file      Social History Narrative   .  No narrative on file      ROS: [x]  Positive   [ ]  Negative   [ ]  All sytems reviewed and are negative  Cardiovascular: []  chest pain/pressure []  palpitations []  SOB lying flat [x]  DOE-COPD [X]  pain in legs while walking-Osteoarthritis   []  pain in feet when lying flat []  hx of DVT []  hx of phlebitis []  swelling in legs []  varicose veins  Pulmonary: []  productive cough []  asthma []  wheezing  Neurologic: [x]  weakness in []  arms [x]  legs [x]  numbness in []  arms [x]  legs [] difficulty speaking or slurred speech []  temporary loss of vision in one eye []  dizziness  Hematologic: []  bleeding problems []  problems with blood clotting easily  GI []  vomiting blood []  blood in stool  GU: []  burning with urination []  blood in urine  Psychiatric: []  hx of major depression  Integumentary: []  rashes []  ulcers  Constitutional: []  fever []  chills   PHYSICAL EXAMINATION:  Filed Vitals:   07/11/13 1003  BP: 119/76  Pulse: 53  Height: 5\' 5"  (1.651 m)  Weight: 212 lb (96.163 kg)  SpO2: 90%     Body mass index is 35.28 kg/(m^2).  General:  WDWN in NAD Gait: unsteady HENT: WNL, normocephalic Abdomen: soft, NT, no masses, obese Skin: without rashes, left second toe dorsal ulcers 2 cm x 1 cm diameter less than 1 mm depth, left lateral malleolus ulcer 1 cm diameter less than 2 mm depth  Vascular Exam/Pulses:1+ bilateral femoral pulses Extremities: without ischemic changes, without Gangrene , without cellulitis Distal pulses are not palplable   Musculoskeletal: no muscle wasting or atrophy       Neurologic: A&O X 3; Appropriate Affect ; SENSATION: normal; MOTOR FUNCTION:  moving all  extremities equally. Speech is fluent/normal   Non-Invasive Vascular Imaging:  ABI's 05/30/13 Right:  0.64 Left:  0.47  Pt meds includes: Statin:  yes Beta Blocker:  yes Aspirin:  yes ACEI:  no ARB:  no Other Antiplatelet/Anticoagulant:  yes-Eliquis   ASSESSMENT: 80 y.o. female with chronic ulcers on left dorsal second toe and left lateral malleolus.  Pt and family member state these have significantly improved.  PLAN: -will have pt to continue to wash daily with soap and water and then apply silvadene and bandage.  She will f/u  in 3-4 weeks for a wound check.    -She is advised to put padding on the chair legs or do away with the chair as her foot bumps the feet of the chair and she is at high risk for non healing wounds and subsequent amputation. She was also advised to pad her walker an area that was abrading her leg  She will followup in a few weeks and if the wound on her foot have not healed spontaneously consider arteriogram at that point. She overall is fairly debilitated and not a very good revascularization candidate.  Ruta Hinds, MD [facility] [place] Office: [phone] Pager: [phone]

## 2013-08-26 ENCOUNTER — Encounter: Payer: Self-pay | Admitting: Vascular Surgery

## 2013-08-27 ENCOUNTER — Ambulatory Visit: Payer: Medicare Other | Admitting: Vascular Surgery

## 2013-08-29 ENCOUNTER — Ambulatory Visit: Payer: Medicare Other | Admitting: Vascular Surgery

## 2014-04-17 ENCOUNTER — Emergency Department (HOSPITAL_COMMUNITY): Payer: Medicare Other

## 2014-04-17 ENCOUNTER — Inpatient Hospital Stay (HOSPITAL_COMMUNITY)
Admission: EM | Admit: 2014-04-17 | Discharge: 2014-05-01 | DRG: 253 | Disposition: A | Discharge status: Skilled Nursing Facility | Payer: Medicare Other | Attending: Vascular Surgery | Admitting: Vascular Surgery

## 2014-04-17 ENCOUNTER — Encounter (HOSPITAL_COMMUNITY): Payer: Self-pay

## 2014-04-17 DIAGNOSIS — Z825 Family history of asthma and other chronic lower respiratory diseases: Secondary | ICD-10-CM

## 2014-04-17 DIAGNOSIS — I5042 Chronic combined systolic (congestive) and diastolic (congestive) heart failure: Secondary | ICD-10-CM

## 2014-04-17 DIAGNOSIS — Z9981 Dependence on supplemental oxygen: Secondary | ICD-10-CM

## 2014-04-17 DIAGNOSIS — R05 Cough: Secondary | ICD-10-CM

## 2014-04-17 DIAGNOSIS — E119 Type 2 diabetes mellitus without complications: Secondary | ICD-10-CM

## 2014-04-17 DIAGNOSIS — Z7901 Long term (current) use of anticoagulants: Secondary | ICD-10-CM

## 2014-04-17 DIAGNOSIS — Z01818 Encounter for other preprocedural examination: Secondary | ICD-10-CM

## 2014-04-17 DIAGNOSIS — L03031 Cellulitis of right toe: Secondary | ICD-10-CM | POA: Diagnosis not present

## 2014-04-17 DIAGNOSIS — R918 Other nonspecific abnormal finding of lung field: Secondary | ICD-10-CM | POA: Diagnosis present

## 2014-04-17 DIAGNOSIS — I2582 Chronic total occlusion of coronary artery: Secondary | ICD-10-CM | POA: Diagnosis present

## 2014-04-17 DIAGNOSIS — R32 Unspecified urinary incontinence: Secondary | ICD-10-CM | POA: Diagnosis present

## 2014-04-17 DIAGNOSIS — L97529 Non-pressure chronic ulcer of other part of left foot with unspecified severity: Secondary | ICD-10-CM | POA: Diagnosis present

## 2014-04-17 DIAGNOSIS — R0600 Dyspnea, unspecified: Secondary | ICD-10-CM

## 2014-04-17 DIAGNOSIS — I2510 Atherosclerotic heart disease of native coronary artery without angina pectoris: Secondary | ICD-10-CM

## 2014-04-17 DIAGNOSIS — Z833 Family history of diabetes mellitus: Secondary | ICD-10-CM

## 2014-04-17 DIAGNOSIS — E1152 Type 2 diabetes mellitus with diabetic peripheral angiopathy with gangrene: Principal | ICD-10-CM | POA: Diagnosis present

## 2014-04-17 DIAGNOSIS — Z87891 Personal history of nicotine dependence: Secondary | ICD-10-CM

## 2014-04-17 DIAGNOSIS — Z85820 Personal history of malignant melanoma of skin: Secondary | ICD-10-CM

## 2014-04-17 DIAGNOSIS — N183 Chronic kidney disease, stage 3 unspecified: Secondary | ICD-10-CM | POA: Diagnosis present

## 2014-04-17 DIAGNOSIS — I708 Atherosclerosis of other arteries: Secondary | ICD-10-CM | POA: Diagnosis present

## 2014-04-17 DIAGNOSIS — R059 Cough, unspecified: Secondary | ICD-10-CM

## 2014-04-17 DIAGNOSIS — J449 Chronic obstructive pulmonary disease, unspecified: Secondary | ICD-10-CM | POA: Diagnosis present

## 2014-04-17 DIAGNOSIS — I429 Cardiomyopathy, unspecified: Secondary | ICD-10-CM | POA: Diagnosis present

## 2014-04-17 DIAGNOSIS — E1165 Type 2 diabetes mellitus with hyperglycemia: Secondary | ICD-10-CM | POA: Diagnosis present

## 2014-04-17 DIAGNOSIS — I4891 Unspecified atrial fibrillation: Secondary | ICD-10-CM | POA: Diagnosis present

## 2014-04-17 DIAGNOSIS — I25119 Atherosclerotic heart disease of native coronary artery with unspecified angina pectoris: Secondary | ICD-10-CM | POA: Diagnosis not present

## 2014-04-17 DIAGNOSIS — I739 Peripheral vascular disease, unspecified: Secondary | ICD-10-CM | POA: Diagnosis present

## 2014-04-17 DIAGNOSIS — I480 Paroxysmal atrial fibrillation: Secondary | ICD-10-CM | POA: Diagnosis present

## 2014-04-17 DIAGNOSIS — Z79899 Other long term (current) drug therapy: Secondary | ICD-10-CM

## 2014-04-17 DIAGNOSIS — N184 Chronic kidney disease, stage 4 (severe): Secondary | ICD-10-CM | POA: Diagnosis present

## 2014-04-17 DIAGNOSIS — I471 Supraventricular tachycardia, unspecified: Secondary | ICD-10-CM

## 2014-04-17 DIAGNOSIS — I252 Old myocardial infarction: Secondary | ICD-10-CM

## 2014-04-17 DIAGNOSIS — E875 Hyperkalemia: Secondary | ICD-10-CM | POA: Diagnosis present

## 2014-04-17 DIAGNOSIS — J9611 Chronic respiratory failure with hypoxia: Secondary | ICD-10-CM | POA: Insufficient documentation

## 2014-04-17 LAB — BASIC METABOLIC PANEL
ANION GAP: 7 (ref 5–15)
BUN: 48 mg/dL — ABNORMAL HIGH (ref 6–23)
CO2: 26 mmol/L (ref 19–32)
CREATININE: 1.89 mg/dL — AB (ref 0.50–1.10)
Calcium: 9.5 mg/dL (ref 8.4–10.5)
Chloride: 105 mmol/L (ref 96–112)
GFR calc non Af Amer: 23 mL/min — ABNORMAL LOW (ref >90)
GFR, EST AFRICAN AMERICAN: 27 mL/min — AB (ref >90)
GLUCOSE: 222 mg/dL — AB (ref 70–99)
POTASSIUM: 4.9 mmol/L (ref 3.5–5.1)
SODIUM: 138 mmol/L (ref 135–145)

## 2014-04-17 LAB — I-STAT CG4 LACTIC ACID, ED: LACTIC ACID, VENOUS: 0.81 mmol/L (ref 0.5–2.0)

## 2014-04-17 LAB — CBC
HEMATOCRIT: 38.2 % (ref 36.0–46.0)
Hemoglobin: 12.4 g/dL (ref 12.0–15.0)
MCH: 30.8 pg (ref 26.0–34.0)
MCHC: 32.5 g/dL (ref 30.0–36.0)
MCV: 95 fL (ref 78.0–100.0)
Platelets: 348 10*3/uL (ref 150–400)
RBC: 4.02 MIL/uL (ref 3.87–5.11)
RDW: 13.1 % (ref 11.5–15.5)
WBC: 4.3 10*3/uL (ref 4.0–10.5)

## 2014-04-17 MED ORDER — SODIUM CHLORIDE 0.9 % IV BOLUS (SEPSIS)
1000.0000 mL | Freq: Once | INTRAVENOUS | Status: AC
  Administered 2014-04-17: 1000 mL via INTRAVENOUS

## 2014-04-17 MED ORDER — PIPERACILLIN-TAZOBACTAM 3.375 G IVPB 30 MIN
3.3750 g | Freq: Once | INTRAVENOUS | Status: AC
  Administered 2014-04-17: 3.375 g via INTRAVENOUS
  Filled 2014-04-17: qty 50

## 2014-04-17 MED ORDER — MORPHINE SULFATE 4 MG/ML IJ SOLN
4.0000 mg | Freq: Once | INTRAMUSCULAR | Status: AC
  Administered 2014-04-17: 4 mg via INTRAVENOUS
  Filled 2014-04-17: qty 1

## 2014-04-17 MED ORDER — VANCOMYCIN HCL IN DEXTROSE 1-5 GM/200ML-% IV SOLN
1000.0000 mg | Freq: Once | INTRAVENOUS | Status: AC
  Administered 2014-04-18: 1000 mg via INTRAVENOUS
  Filled 2014-04-17: qty 200

## 2014-04-17 NOTE — ED Notes (Signed)
Pt with reported infection to right 3rd-4th toe.  Pt seen by her PCP and told to come to ED for evaluation of possible infection.

## 2014-04-17 NOTE — ED Provider Notes (Signed)
CSN: [id number]     Arrival date & time 04/17/14  2057 History  This chart was scribed for Wandra Arthurs, MD by Eustaquio Maize, ED Scribe. This patient was seen in room B15C/B15C and the patient's care was started at 11:04 PM.    Chief Complaint  Patient presents with  . Wound Infection   The history is provided by the patient. No language interpreter was used.     HPI Comments: Julia Goodman is a 81 y.o. female with past medical history of Diabetes mellitus and peripheral vascular disease s/p L common iliac stent who presents to the Emergency Department complaining of an infection to the 3rd and 4th toes of the right foot that began 2 weeks ago. Pt reports that she accidentally kicked her stove, which created an ulcer on her 3rd and 4th toes of the left foot. She complains of increased pain and tenderness to area. She states the ulcer has been increasing in size as well.  Pt saw her Podiatrist, Dr. Phyllis Ginger, who referred pt to vascular surgeon, Dr. Oneida Alar. Pt states that she was told to come to the ED for evaluation. Pt denies fever or any other symptoms.   Podiatrist - Dr. Phyllis Ginger Vascular Surgeon - Dr. Oneida Alar    Past Medical History  Diagnosis Date  . Diabetes mellitus   . Leg pain   . COPD (chronic obstructive pulmonary disease)   . Cardiomyopathy   . Irregular heartbeat   . Coronary artery disease   . Myocardial infarction   . Peripheral vascular disease   . Cancer     Skin Melanoma  . CHF (congestive heart failure)   . Arthritis     OSTEOARTHRITIS   Past Surgical History  Procedure Laterality Date  . Melanoma excision    . Finger surgery    . Aortogram with stenting  11-05-10    Left CIA stent  . Fracture surgery  Feb. 2013    Spine  . Cholecystectomy      Gall Bladder   Family History  Problem Relation Age of Onset  . Diabetes Other   . COPD Mother   . Cancer Father   . Diabetes Sister   . Hyperlipidemia Daughter   . Hypertension Daughter   . Hyperlipidemia Son    . Hypertension Son    History  Substance Use Topics  . Smoking status: Former Smoker -- 68 years    Types: Cigarettes    Quit date: 11/04/1999  . Smokeless tobacco: Never Used  . Alcohol Use: No   OB History    No data available     Review of Systems  Constitutional: Negative for fever.  Musculoskeletal: Positive for arthralgias (Pain and tenderness to 3rd and 4th digits of right foot. ).  Skin:       Ulcer to right foot.   All other systems reviewed and are negative.     Allergies  Hydrocodone; Ace inhibitors; Ciprofloxacin; Ketamine; Niaspan; and Zetia  Home Medications   Prior to Admission medications   Medication Sig Start Date End Date Taking? Authorizing Provider  acetaminophen-codeine (TYLENOL #3) 300-30 MG per tablet Take 1 tablet by mouth every 4 (four) hours as needed.     Yes Historical Provider, MD  amitriptyline (ELAVIL) 25 MG tablet Take 25 mg by mouth at bedtime.   Yes Historical Provider, MD  AMITRIPTYLINE HCL PO Take 25 mg by mouth daily.    Yes Historical Provider, MD  apixaban (ELIQUIS) 2.5 MG TABS tablet  Take 2.5 mg by mouth 2 (two) times daily.   Yes Historical Provider, MD  Calcium Carbonate-Vitamin D (OSCAL 500/200 D-3 PO) Take 1 tablet by mouth daily.    Yes Historical Provider, MD  citalopram (CELEXA) 10 MG tablet Take 5 mg by mouth daily.   Yes Historical Provider, MD  donepezil (ARICEPT) 10 MG tablet Take 10 mg by mouth at bedtime.   Yes Historical Provider, MD  ergocalciferol (VITAMIN D2) 50000 UNITS capsule Take 50,000 Units by mouth once a week.     Yes Historical Provider, MD  fenofibrate 160 MG tablet Take 160 mg by mouth daily.   Yes Historical Provider, MD  fexofenadine (ALLEGRA) 180 MG tablet Take 180 mg by mouth daily.   Yes Historical Provider, MD  fish oil-omega-3 fatty acids 1000 MG capsule Take 2 g by mouth daily.     Yes Historical Provider, MD  fluticasone (FLONASE) 50 MCG/ACT nasal spray Place 1 spray into both nostrils daily.    Yes Historical Provider, MD  furosemide (LASIX) 20 MG tablet Take 20 mg by mouth every other day.   Yes Historical Provider, MD  Ginkgo Biloba (GINKGO PO) Take 1 tablet by mouth daily.    Yes Historical Provider, MD  glipiZIDE (GLUCOTROL) 5 MG tablet Take 5 mg by mouth 2 (two) times daily before a meal. Takes 2 tablets twice daily.   Yes Historical Provider, MD  Multiple Vitamin (MULTIVITAMIN) capsule Take 0.5 capsules by mouth 2 (two) times daily.    Yes Historical Provider, MD  nabumetone (RELAFEN) 500 MG tablet Take 500 mg by mouth 2 (two) times daily.   Yes Historical Provider, MD  nitroGLYCERIN (NITROSTAT) 0.4 MG SL tablet Place 0.4 mg under the tongue every 5 (five) minutes as needed for chest pain.   Yes Historical Provider, MD  pravastatin (PRAVACHOL) 80 MG tablet Take 80 mg by mouth daily.   Yes Historical Provider, MD  sitaGLIPtin (JANUVIA) 100 MG tablet Take 100 mg by mouth daily.   Yes Historical Provider, MD  sotalol (BETAPACE) 80 MG tablet Take 80 mg by mouth 2 (two) times daily.   Yes Historical Provider, MD  CARVEDILOL PO Take 6.25 mg by mouth daily.     Historical Provider, MD  DILTIAZEM HCL PO Take 120 mg by mouth daily.     Historical Provider, MD  Fenofibrate (TRICOR PO) Take 160 mg by mouth daily.     Historical Provider, MD  FOLIC ACID PO Take by mouth.      Historical Provider, MD  GLYBURIDE PO Take 5 mg by mouth.     Historical Provider, MD  Ipratropium-Albuterol (DUONEB IN) Inhale into the lungs.      Historical Provider, MD  Loratadine (CLARITIN PO) Take by mouth.      Historical Provider, MD  METFORMIN HCL PO Take by mouth.      Historical Provider, MD  NABUMETONE PO Take by mouth.      Historical Provider, MD  Nitroglycerin [facility] TD) Place onto the skin.      Historical Provider, MD   Triage Vitals: BP 152/50 mmHg  Pulse 49  Temp(Src) 97.5 F (36.4 C) (Oral)  Resp 20  Ht 5' 6" (1.676 m)  Wt 205 lb (92.987 kg)  BMI 33.10 kg/m2  SpO2 96%   Physical Exam   Constitutional: She is oriented to person, place, and time. She appears well-developed and well-nourished. No distress.  HENT:  Head: Normocephalic and atraumatic.  Eyes: Conjunctivae and EOM are normal.  Neck: Neck supple. No tracheal deviation present.  Cardiovascular: Normal rate, regular rhythm and normal heart sounds.   Pulmonary/Chest: Effort normal and breath sounds normal. No respiratory distress.  Abdominal: Soft. Bowel sounds are normal. There is no tenderness.  Musculoskeletal: Normal range of motion.  3rd and 4th toe ulcers with surrounding erythema. Diminished pedal pulse.   Neurological: She is alert and oriented to person, place, and time.  Skin: Skin is warm and dry.  Psychiatric: She has a normal mood and affect. Her behavior is normal.  Nursing note and vitals reviewed.   ED Course  Procedures (including critical care time)  DIAGNOSTIC STUDIES: Oxygen Saturation is 96% on RA, normal by my interpretation.    COORDINATION OF CARE: 11:09 PM-Discussed treatment plan which includes consult with Dr. Oneida Alar with pt at bedside and pt agreed to plan.   Labs Review Labs Reviewed  BASIC METABOLIC PANEL - Abnormal; Notable for the following:    Glucose, Bld 222 (*)    BUN 48 (*)    Creatinine, Ser 1.89 (*)    GFR calc non Af Amer 23 (*)    GFR calc Af Amer 27 (*)    All other components within normal limits  CBC    Imaging Review Dg Foot Complete Right  04/17/2014   CLINICAL DATA:  Right foot swelling and drainage from second and fourth toes. Trauma last week. Initial encounter.  EXAM: RIGHT FOOT COMPLETE - 3+ VIEW  COMPARISON:  None.  FINDINGS: The bones are demineralized. No acute fracture, dislocation or bone destruction identified. There are mild degenerative changes at the first metatarsal phalangeal joint. There is generalized forefoot soft tissue swelling without evidence of foreign body or soft tissue emphysema. Scattered vascular and soft tissue calcifications  are noted in the distal lower leg.  IMPRESSION: No acute osseous findings or radiographic evidence of osteomyelitis.   Electronically Signed   By: Richardean Sale M.D.   On: 04/17/2014 22:54     EKG Interpretation None      MDM   Final diagnoses:  None   JONASIA COINER is a 81 y.o. female here with R 3rd and 4th toe ulcer with cellulitis. Concerned for osteo vs cellulitis. Will get ESR, labs, cultures. Will consult vascular regarding amputation. Will give abx empirically.  11: 30 PM Discussed with Dr. Scot Dock from vascular who will see patient tonight and recommend IV abx and medical admission. Xray showed no obvious osteo. Given vanc/zosyn for cellulitis.    12 AM Dr. Scot Dock will admit.        Wandra Arthurs, MD 04/18/14 [phone]

## 2014-04-18 DIAGNOSIS — E1122 Type 2 diabetes mellitus with diabetic chronic kidney disease: Secondary | ICD-10-CM | POA: Diagnosis not present

## 2014-04-18 DIAGNOSIS — Z01810 Encounter for preprocedural cardiovascular examination: Secondary | ICD-10-CM | POA: Diagnosis not present

## 2014-04-18 DIAGNOSIS — E0822 Diabetes mellitus due to underlying condition with diabetic chronic kidney disease: Secondary | ICD-10-CM | POA: Diagnosis not present

## 2014-04-18 DIAGNOSIS — I480 Paroxysmal atrial fibrillation: Secondary | ICD-10-CM | POA: Diagnosis not present

## 2014-04-18 DIAGNOSIS — R32 Unspecified urinary incontinence: Secondary | ICD-10-CM | POA: Diagnosis present

## 2014-04-18 DIAGNOSIS — R918 Other nonspecific abnormal finding of lung field: Secondary | ICD-10-CM | POA: Diagnosis present

## 2014-04-18 DIAGNOSIS — E1152 Type 2 diabetes mellitus with diabetic peripheral angiopathy with gangrene: Secondary | ICD-10-CM | POA: Diagnosis present

## 2014-04-18 DIAGNOSIS — J449 Chronic obstructive pulmonary disease, unspecified: Secondary | ICD-10-CM | POA: Diagnosis present

## 2014-04-18 DIAGNOSIS — I70261 Atherosclerosis of native arteries of extremities with gangrene, right leg: Secondary | ICD-10-CM

## 2014-04-18 DIAGNOSIS — Z9889 Other specified postprocedural states: Secondary | ICD-10-CM | POA: Diagnosis not present

## 2014-04-18 DIAGNOSIS — I429 Cardiomyopathy, unspecified: Secondary | ICD-10-CM | POA: Diagnosis present

## 2014-04-18 DIAGNOSIS — R079 Chest pain, unspecified: Secondary | ICD-10-CM | POA: Diagnosis not present

## 2014-04-18 DIAGNOSIS — I252 Old myocardial infarction: Secondary | ICD-10-CM | POA: Diagnosis not present

## 2014-04-18 DIAGNOSIS — E875 Hyperkalemia: Secondary | ICD-10-CM | POA: Diagnosis present

## 2014-04-18 DIAGNOSIS — Z9981 Dependence on supplemental oxygen: Secondary | ICD-10-CM | POA: Diagnosis not present

## 2014-04-18 DIAGNOSIS — Z87891 Personal history of nicotine dependence: Secondary | ICD-10-CM | POA: Diagnosis not present

## 2014-04-18 DIAGNOSIS — L97529 Non-pressure chronic ulcer of other part of left foot with unspecified severity: Secondary | ICD-10-CM | POA: Diagnosis present

## 2014-04-18 DIAGNOSIS — I739 Peripheral vascular disease, unspecified: Secondary | ICD-10-CM

## 2014-04-18 DIAGNOSIS — I70234 Atherosclerosis of native arteries of right leg with ulceration of heel and midfoot: Secondary | ICD-10-CM | POA: Diagnosis not present

## 2014-04-18 DIAGNOSIS — E119 Type 2 diabetes mellitus without complications: Secondary | ICD-10-CM

## 2014-04-18 DIAGNOSIS — N189 Chronic kidney disease, unspecified: Secondary | ICD-10-CM | POA: Diagnosis not present

## 2014-04-18 DIAGNOSIS — Z7901 Long term (current) use of anticoagulants: Secondary | ICD-10-CM

## 2014-04-18 DIAGNOSIS — Z85820 Personal history of malignant melanoma of skin: Secondary | ICD-10-CM | POA: Diagnosis not present

## 2014-04-18 DIAGNOSIS — I482 Chronic atrial fibrillation: Secondary | ICD-10-CM | POA: Diagnosis not present

## 2014-04-18 DIAGNOSIS — N183 Chronic kidney disease, stage 3 (moderate): Secondary | ICD-10-CM | POA: Diagnosis not present

## 2014-04-18 DIAGNOSIS — L03031 Cellulitis of right toe: Secondary | ICD-10-CM | POA: Diagnosis present

## 2014-04-18 DIAGNOSIS — Z825 Family history of asthma and other chronic lower respiratory diseases: Secondary | ICD-10-CM | POA: Diagnosis not present

## 2014-04-18 DIAGNOSIS — Z79899 Other long term (current) drug therapy: Secondary | ICD-10-CM | POA: Diagnosis not present

## 2014-04-18 DIAGNOSIS — I509 Heart failure, unspecified: Secondary | ICD-10-CM | POA: Diagnosis not present

## 2014-04-18 DIAGNOSIS — J9611 Chronic respiratory failure with hypoxia: Secondary | ICD-10-CM | POA: Diagnosis present

## 2014-04-18 DIAGNOSIS — I2582 Chronic total occlusion of coronary artery: Secondary | ICD-10-CM | POA: Diagnosis present

## 2014-04-18 DIAGNOSIS — Z833 Family history of diabetes mellitus: Secondary | ICD-10-CM | POA: Diagnosis not present

## 2014-04-18 DIAGNOSIS — N184 Chronic kidney disease, stage 4 (severe): Secondary | ICD-10-CM | POA: Diagnosis present

## 2014-04-18 DIAGNOSIS — I25119 Atherosclerotic heart disease of native coronary artery with unspecified angina pectoris: Secondary | ICD-10-CM | POA: Diagnosis not present

## 2014-04-18 DIAGNOSIS — E1165 Type 2 diabetes mellitus with hyperglycemia: Secondary | ICD-10-CM | POA: Diagnosis present

## 2014-04-18 DIAGNOSIS — I471 Supraventricular tachycardia: Secondary | ICD-10-CM | POA: Diagnosis not present

## 2014-04-18 DIAGNOSIS — I708 Atherosclerosis of other arteries: Secondary | ICD-10-CM | POA: Diagnosis present

## 2014-04-18 DIAGNOSIS — I5042 Chronic combined systolic (congestive) and diastolic (congestive) heart failure: Secondary | ICD-10-CM | POA: Diagnosis not present

## 2014-04-18 DIAGNOSIS — I2510 Atherosclerotic heart disease of native coronary artery without angina pectoris: Secondary | ICD-10-CM | POA: Diagnosis not present

## 2014-04-18 LAB — URINALYSIS, ROUTINE W REFLEX MICROSCOPIC
BILIRUBIN URINE: NEGATIVE
Bilirubin Urine: NEGATIVE
Glucose, UA: NEGATIVE mg/dL
Glucose, UA: NEGATIVE mg/dL
Hgb urine dipstick: NEGATIVE
Hgb urine dipstick: NEGATIVE
KETONES UR: NEGATIVE mg/dL
KETONES UR: NEGATIVE mg/dL
LEUKOCYTES UA: NEGATIVE
Leukocytes, UA: NEGATIVE
NITRITE: NEGATIVE
Nitrite: NEGATIVE
PROTEIN: NEGATIVE mg/dL
PROTEIN: NEGATIVE mg/dL
SPECIFIC GRAVITY, URINE: 1.02 (ref 1.005–1.030)
Specific Gravity, Urine: 1.021 (ref 1.005–1.030)
UROBILINOGEN UA: 0.2 mg/dL (ref 0.0–1.0)
Urobilinogen, UA: 0.2 mg/dL (ref 0.0–1.0)
pH: 5.5 (ref 5.0–8.0)
pH: 5.5 (ref 5.0–8.0)

## 2014-04-18 LAB — COMPREHENSIVE METABOLIC PANEL
ALBUMIN: 2.8 g/dL — AB (ref 3.5–5.2)
ALT: 25 U/L (ref 0–35)
AST: 23 U/L (ref 0–37)
Alkaline Phosphatase: 52 U/L (ref 39–117)
Anion gap: 10 (ref 5–15)
BILIRUBIN TOTAL: 0.6 mg/dL (ref 0.3–1.2)
BUN: 42 mg/dL — ABNORMAL HIGH (ref 6–23)
CHLORIDE: 107 mmol/L (ref 96–112)
CO2: 22 mmol/L (ref 19–32)
CREATININE: 1.64 mg/dL — AB (ref 0.50–1.10)
Calcium: 8.9 mg/dL (ref 8.4–10.5)
GFR calc Af Amer: 32 mL/min — ABNORMAL LOW (ref >90)
GFR calc non Af Amer: 27 mL/min — ABNORMAL LOW (ref >90)
Glucose, Bld: 129 mg/dL — ABNORMAL HIGH (ref 70–99)
Potassium: 5 mmol/L (ref 3.5–5.1)
Sodium: 139 mmol/L (ref 135–145)
Total Protein: 7.1 g/dL (ref 6.0–8.3)

## 2014-04-18 LAB — CBC
HCT: 37.6 % (ref 36.0–46.0)
Hemoglobin: 12.4 g/dL (ref 12.0–15.0)
MCH: 31.5 pg (ref 26.0–34.0)
MCHC: 33 g/dL (ref 30.0–36.0)
MCV: 95.4 fL (ref 78.0–100.0)
Platelets: 328 10*3/uL (ref 150–400)
RBC: 3.94 MIL/uL (ref 3.87–5.11)
RDW: 13.2 % (ref 11.5–15.5)
WBC: 5.7 10*3/uL (ref 4.0–10.5)

## 2014-04-18 LAB — APTT
aPTT: 34 seconds (ref 24–37)
aPTT: 42 seconds — ABNORMAL HIGH (ref 24–37)

## 2014-04-18 LAB — PROTIME-INR
INR: 1.18 (ref 0.00–1.49)
PROTHROMBIN TIME: 15.1 s (ref 11.6–15.2)

## 2014-04-18 LAB — TSH: TSH: 5.07 u[IU]/mL — ABNORMAL HIGH (ref 0.350–4.500)

## 2014-04-18 LAB — GLUCOSE, CAPILLARY
GLUCOSE-CAPILLARY: 137 mg/dL — AB (ref 70–99)
GLUCOSE-CAPILLARY: 140 mg/dL — AB (ref 70–99)
GLUCOSE-CAPILLARY: 141 mg/dL — AB (ref 70–99)
Glucose-Capillary: 103 mg/dL — ABNORMAL HIGH (ref 70–99)

## 2014-04-18 LAB — SEDIMENTATION RATE: SED RATE: 82 mm/h — AB (ref 0–22)

## 2014-04-18 LAB — HEPARIN LEVEL (UNFRACTIONATED)
HEPARIN UNFRACTIONATED: 0.4 [IU]/mL (ref 0.30–0.70)
Heparin Unfractionated: 0.27 IU/mL — ABNORMAL LOW (ref 0.30–0.70)

## 2014-04-18 MED ORDER — GINKGO 60 MG PO TABS: ORAL_TABLET | Freq: Every day | ORAL | Status: DC

## 2014-04-18 MED ORDER — PRAVASTATIN SODIUM 80 MG PO TABS
80.0000 mg | ORAL_TABLET | Freq: Every day | ORAL | Status: DC
  Administered 2014-04-18 – 2014-04-20 (×3): 80 mg via ORAL
  Filled 2014-04-18 (×4): qty 1

## 2014-04-18 MED ORDER — HEPARIN (PORCINE) IN NACL 100-0.45 UNIT/ML-% IJ SOLN
1050.0000 [IU]/h | INTRAMUSCULAR | Status: DC
  Administered 2014-04-18: 1050 [IU]/h via INTRAVENOUS
  Filled 2014-04-18 (×2): qty 250

## 2014-04-18 MED ORDER — NABUMETONE 500 MG PO TABS
500.0000 mg | ORAL_TABLET | Freq: Two times a day (BID) | ORAL | Status: DC
  Administered 2014-04-18 – 2014-04-19 (×2): 500 mg via ORAL
  Filled 2014-04-18 (×4): qty 1

## 2014-04-18 MED ORDER — PANTOPRAZOLE SODIUM 40 MG PO TBEC
40.0000 mg | DELAYED_RELEASE_TABLET | Freq: Every day | ORAL | Status: DC
  Administered 2014-04-19 – 2014-04-20 (×2): 40 mg via ORAL
  Filled 2014-04-18 (×2): qty 1

## 2014-04-18 MED ORDER — LORATADINE 10 MG PO TABS: 10.0000 mg | ORAL_TABLET | Freq: Every day | ORAL | Status: DC

## 2014-04-18 MED ORDER — FUROSEMIDE 20 MG PO TABS
20.0000 mg | ORAL_TABLET | ORAL | Status: DC
  Administered 2014-04-20: 20 mg via ORAL
  Filled 2014-04-18: qty 1

## 2014-04-18 MED ORDER — ADULT MULTIVITAMIN W/MINERALS CH
0.5000 | ORAL_TABLET | Freq: Two times a day (BID) | ORAL | Status: DC
Start: 2014-04-18 — End: 2014-04-21
  Administered 2014-04-18 – 2014-04-20 (×5): 0.5 via ORAL
  Filled 2014-04-18 (×6): qty 1

## 2014-04-18 MED ORDER — NITROGLYCERIN 0.4 MG/HR TD PT24
0.4000 mg | MEDICATED_PATCH | Freq: Every day | TRANSDERMAL | Status: DC
  Administered 2014-04-19 – 2014-04-21 (×3): 0.4 mg via TRANSDERMAL
  Filled 2014-04-18 (×4): qty 1

## 2014-04-18 MED ORDER — ENOXAPARIN SODIUM 40 MG/0.4ML ~~LOC~~ SOLN
40.0000 mg | SUBCUTANEOUS | Status: DC
Start: 2014-04-18 — End: 2014-04-18
  Filled 2014-04-18: qty 0.4

## 2014-04-18 MED ORDER — AMITRIPTYLINE HCL 25 MG PO TABS: 25.0000 mg | ORAL_TABLET | Freq: Every day | ORAL | Status: DC

## 2014-04-18 MED ORDER — NITROGLYCERIN 0.4 MG SL SUBL: 0.4000 mg | SUBLINGUAL_TABLET | SUBLINGUAL | Status: DC | PRN

## 2014-04-18 MED ORDER — GUAIFENESIN-DM 100-10 MG/5ML PO SYRP
15.0000 mL | ORAL_SOLUTION | ORAL | Status: DC | PRN
  Filled 2014-04-18: qty 15

## 2014-04-18 MED ORDER — ESCITALOPRAM OXALATE 5 MG PO TABS
5.0000 mg | ORAL_TABLET | Freq: Two times a day (BID) | ORAL | Status: DC
  Administered 2014-04-18 – 2014-04-24 (×12): 5 mg via ORAL
  Filled 2014-04-18 (×17): qty 1

## 2014-04-18 MED ORDER — SODIUM CHLORIDE 0.45 % IV SOLN: INTRAVENOUS | Status: DC

## 2014-04-18 MED ORDER — GLUCERNA SHAKE PO LIQD
237.0000 mL | Freq: Three times a day (TID) | ORAL | Status: DC
  Administered 2014-04-18 – 2014-05-01 (×21): 237 mL via ORAL

## 2014-04-18 MED ORDER — METFORMIN HCL 500 MG PO TABS: 500.0000 mg | ORAL_TABLET | Freq: Every day | ORAL | Status: DC

## 2014-04-18 MED ORDER — GLIPIZIDE 5 MG PO TABS: 5.0000 mg | ORAL_TABLET | Freq: Two times a day (BID) | ORAL | Status: DC

## 2014-04-18 MED ORDER — VANCOMYCIN HCL IN DEXTROSE 750-5 MG/150ML-% IV SOLN
750.0000 mg | INTRAVENOUS | Status: DC
  Filled 2014-04-18: qty 150

## 2014-04-18 MED ORDER — PHENOL 1.4 % MT LIQD: 1.0000 | OROMUCOSAL | Status: DC | PRN

## 2014-04-18 MED ORDER — FLUTICASONE PROPIONATE 50 MCG/ACT NA SUSP
1.0000 | Freq: Every day | NASAL | Status: DC
  Administered 2014-04-19 – 2014-04-21 (×3): 1 via NASAL
  Filled 2014-04-18: qty 16

## 2014-04-18 MED ORDER — ACETAMINOPHEN-CODEINE #3 300-30 MG PO TABS
1.0000 | ORAL_TABLET | ORAL | Status: DC | PRN
  Administered 2014-04-18 – 2014-04-20 (×8): 1 via ORAL
  Filled 2014-04-18 (×9): qty 1

## 2014-04-18 MED ORDER — AMITRIPTYLINE HCL 25 MG PO TABS
25.0000 mg | ORAL_TABLET | Freq: Every day | ORAL | Status: DC
  Administered 2014-04-18 – 2014-04-20 (×3): 25 mg via ORAL
  Filled 2014-04-18 (×3): qty 1

## 2014-04-18 MED ORDER — OMEGA-3-ACID ETHYL ESTERS 1 G PO CAPS
1.0000 g | ORAL_CAPSULE | Freq: Every day | ORAL | Status: DC
  Administered 2014-04-19 – 2014-04-20 (×2): 1 g via ORAL
  Filled 2014-04-18 (×2): qty 1

## 2014-04-18 MED ORDER — FENOFIBRATE 54 MG PO TABS: 54.0000 mg | ORAL_TABLET | Freq: Every day | ORAL | Status: DC

## 2014-04-18 MED ORDER — LINAGLIPTIN 5 MG PO TABS
5.0000 mg | ORAL_TABLET | Freq: Every day | ORAL | Status: DC
  Administered 2014-04-19 – 2014-04-20 (×2): 5 mg via ORAL
  Filled 2014-04-18 (×2): qty 1

## 2014-04-18 MED ORDER — LABETALOL HCL 5 MG/ML IV SOLN
10.0000 mg | INTRAVENOUS | Status: DC | PRN
  Filled 2014-04-18: qty 4

## 2014-04-18 MED ORDER — PIPERACILLIN-TAZOBACTAM 3.375 G IVPB
3.3750 g | Freq: Three times a day (TID) | INTRAVENOUS | Status: DC
  Administered 2014-04-18 – 2014-04-22 (×11): 3.375 g via INTRAVENOUS
  Filled 2014-04-18 (×18): qty 50

## 2014-04-18 MED ORDER — SOTALOL HCL 80 MG PO TABS
80.0000 mg | ORAL_TABLET | Freq: Two times a day (BID) | ORAL | Status: DC
  Administered 2014-04-18 – 2014-04-20 (×6): 80 mg via ORAL
  Filled 2014-04-18 (×9): qty 1

## 2014-04-18 MED ORDER — POTASSIUM CHLORIDE CRYS ER 20 MEQ PO TBCR: 20.0000 meq | EXTENDED_RELEASE_TABLET | Freq: Once | ORAL | Status: DC

## 2014-04-18 MED ORDER — METOPROLOL TARTRATE 1 MG/ML IV SOLN
2.0000 mg | INTRAVENOUS | Status: DC | PRN
  Filled 2014-04-18: qty 5

## 2014-04-18 MED ORDER — GLIPIZIDE 5 MG PO TABS
10.0000 mg | ORAL_TABLET | Freq: Two times a day (BID) | ORAL | Status: DC
Start: 2014-04-18 — End: 2014-04-21
  Administered 2014-04-18 – 2014-04-20 (×5): 10 mg via ORAL
  Filled 2014-04-18 (×5): qty 2

## 2014-04-18 MED ORDER — NITROGLYCERIN 0.1 MG/HR TD PT24
0.1000 mg | MEDICATED_PATCH | Freq: Every day | TRANSDERMAL | Status: DC
  Filled 2014-04-18: qty 1

## 2014-04-18 MED ORDER — ONDANSETRON HCL 4 MG/2ML IJ SOLN: 4.0000 mg | Freq: Four times a day (QID) | INTRAMUSCULAR | Status: DC | PRN

## 2014-04-18 MED ORDER — NABUMETONE 500 MG PO TABS: 500.0000 mg | ORAL_TABLET | Freq: Every day | ORAL | Status: DC

## 2014-04-18 MED ORDER — LORATADINE 10 MG PO TABS
10.0000 mg | ORAL_TABLET | Freq: Every day | ORAL | Status: DC
  Administered 2014-04-19 – 2014-04-20 (×2): 10 mg via ORAL
  Filled 2014-04-18 (×2): qty 1

## 2014-04-18 MED ORDER — VITAMIN D (ERGOCALCIFEROL) 1.25 MG (50000 UNIT) PO CAPS
50000.0000 [IU] | ORAL_CAPSULE | ORAL | Status: DC
  Administered 2014-04-19: 50000 [IU] via ORAL
  Filled 2014-04-18: qty 1

## 2014-04-18 MED ORDER — ALUM & MAG HYDROXIDE-SIMETH 200-200-20 MG/5ML PO SUSP: 15.0000 mL | ORAL | Status: DC | PRN

## 2014-04-18 MED ORDER — INSULIN ASPART 100 UNIT/ML ~~LOC~~ SOLN
0.0000 [IU] | Freq: Three times a day (TID) | SUBCUTANEOUS | Status: DC
  Administered 2014-04-18 (×3): 2 [IU] via SUBCUTANEOUS
  Administered 2014-04-19: 5 [IU] via SUBCUTANEOUS
  Administered 2014-04-20 (×2): 2 [IU] via SUBCUTANEOUS
  Administered 2014-04-20 – 2014-04-22 (×2): 3 [IU] via SUBCUTANEOUS
  Administered 2014-04-23: 2 [IU] via SUBCUTANEOUS
  Administered 2014-04-23: 3 [IU] via SUBCUTANEOUS
  Administered 2014-04-25: 2 [IU] via SUBCUTANEOUS
  Administered 2014-04-25: 3 [IU] via SUBCUTANEOUS
  Administered 2014-04-25 – 2014-04-26 (×2): 2 [IU] via SUBCUTANEOUS
  Administered 2014-04-26: 5 [IU] via SUBCUTANEOUS
  Administered 2014-04-27 – 2014-04-28 (×2): 2 [IU] via SUBCUTANEOUS
  Administered 2014-04-28: 5 [IU] via SUBCUTANEOUS
  Administered 2014-04-29: 2 [IU] via SUBCUTANEOUS
  Administered 2014-04-30: 3 [IU] via SUBCUTANEOUS
  Administered 2014-04-30 (×2): 2 [IU] via SUBCUTANEOUS

## 2014-04-18 MED ORDER — DONEPEZIL HCL 10 MG PO TABS
10.0000 mg | ORAL_TABLET | Freq: Every day | ORAL | Status: DC
  Administered 2014-04-18 – 2014-04-20 (×3): 10 mg via ORAL
  Filled 2014-04-18 (×3): qty 1

## 2014-04-18 MED ORDER — FENOFIBRATE 160 MG PO TABS
160.0000 mg | ORAL_TABLET | Freq: Every day | ORAL | Status: DC
  Administered 2014-04-19 – 2014-04-20 (×2): 160 mg via ORAL
  Filled 2014-04-18 (×2): qty 1

## 2014-04-18 MED ORDER — MORPHINE SULFATE 2 MG/ML IJ SOLN
1.0000 mg | INTRAMUSCULAR | Status: DC | PRN
  Administered 2014-04-18 – 2014-05-01 (×36): 1 mg via INTRAVENOUS
  Filled 2014-04-18 (×38): qty 1

## 2014-04-18 MED ORDER — ENSURE COMPLETE PO LIQD: 237.0000 mL | Freq: Two times a day (BID) | ORAL | Status: DC

## 2014-04-18 MED ORDER — GLYBURIDE 5 MG PO TABS: 5.0000 mg | ORAL_TABLET | Freq: Every day | ORAL | Status: DC

## 2014-04-18 MED ORDER — VANCOMYCIN HCL IN DEXTROSE 1-5 GM/200ML-% IV SOLN
1000.0000 mg | INTRAVENOUS | Status: DC
  Administered 2014-04-19 – 2014-04-21 (×4): 1000 mg via INTRAVENOUS
  Filled 2014-04-18 (×6): qty 200

## 2014-04-18 MED ORDER — POTASSIUM CHLORIDE IN NACL 20-0.9 MEQ/L-% IV SOLN
INTRAVENOUS | Status: DC
Start: 2014-04-18 — End: 2014-04-19
  Administered 2014-04-18 – 2014-04-19 (×2): via INTRAVENOUS
  Filled 2014-04-18 (×3): qty 1000

## 2014-04-18 MED ORDER — DILTIAZEM HCL 60 MG PO TABS
120.0000 mg | ORAL_TABLET | Freq: Every day | ORAL | Status: DC
  Filled 2014-04-18: qty 2

## 2014-04-18 MED ORDER — HYDRALAZINE HCL 20 MG/ML IJ SOLN: 5.0000 mg | INTRAMUSCULAR | Status: DC | PRN

## 2014-04-18 MED ORDER — CALCIUM CARBONATE-VITAMIN D 500-200 MG-UNIT PO TABS
1.0000 | ORAL_TABLET | Freq: Every day | ORAL | Status: DC
  Administered 2014-04-19 – 2014-04-20 (×2): 1 via ORAL
  Filled 2014-04-18 (×2): qty 1

## 2014-04-18 MED ORDER — CITALOPRAM HYDROBROMIDE 10 MG PO TABS
5.0000 mg | ORAL_TABLET | Freq: Every day | ORAL | Status: DC
Start: 2014-04-18 — End: 2014-04-18

## 2014-04-18 MED ORDER — FOLIC ACID 1 MG PO TABS
1.0000 mg | ORAL_TABLET | Freq: Every day | ORAL | Status: DC
  Administered 2014-04-19 – 2014-04-20 (×2): 1 mg via ORAL
  Filled 2014-04-18 (×2): qty 1

## 2014-04-18 MED ORDER — CARVEDILOL 6.25 MG PO TABS: 6.2500 mg | ORAL_TABLET | Freq: Every day | ORAL | Status: DC

## 2014-04-18 NOTE — Consult Note (Signed)
[facility]  Subjective  - right foot still hurts   Objective 157/123 64 97.8 F (36.6 C) (Oral) 17 90%  Intake/Output Summary (Last 24 hours) at 04/18/14 1424 Last data filed at 04/18/14 0700  Gross per 24 hour  Intake    470 ml  Output      0 ml  Net    470 ml   Left foot ulcer dorsal 3rd toe superficial Right foot erythema with ulceration toes 3 and 4 2+ left femoral pulse Absent right femoral pulse No popliteal or pedal pulses  Assessment/Planning: Gangrene and cellulitis right foot  Pt most likely with multilevel arterial occlusive disease now with non healing wound right foot.  Will plan for arteriogram on Monday around noon.  Please gently hydrate with IV fluid starting Monday at midnight in light of her underlying renal dysfunction.  Risks benefits and possible complications of agram and intervention explained to pt including but not limited to bleeding infection contrast nephropathy  Lenni Reckner E 04/18/2014 2:24 PM --  Laboratory Lab Results:  Recent Labs  04/17/14 2142 04/18/14 0323  WBC 4.3 5.7  HGB 12.4 12.4  HCT 38.2 37.6  PLT 348 328   BMET  Recent Labs  04/17/14 2142 04/18/14 0323  NA 138 139  K 4.9 5.0  CL 105 107  CO2 26 22  GLUCOSE 222* 129*  BUN 48* 42*  CREATININE 1.89* 1.64*  CALCIUM 9.5 8.9    COAG Lab Results  Component Value Date   INR 1.18 04/18/2014   No results found for: PTT

## 2014-04-18 NOTE — Progress Notes (Signed)
   VASCULAR SURGERY ASSESSMENT & PLAN:  * Wounds on the right foot are unchanged involving the third and fourth toes.   * Continue intravenous vancomycin and Zosyn. Her Eliquis is being hold that she will need an arteriogram.   * Continue gentle hydration as she has mildly elevated creatinine.  * Medicine following.  SUBJECTIVE: No specific complaints.  PHYSICAL EXAM: Filed Vitals:   04/18/14 0100 04/18/14 0115 04/18/14 0217 04/18/14 0441  BP: 136/72 151/74 164/66 157/123  Pulse: 56 59 61 64  Temp:  97.8 F (36.6 C) 97.7 F (36.5 C) 97.8 F (36.6 C)  TempSrc:  Oral Oral   Resp:  13 17 17   Height:      Weight:      SpO2: 98% 98% 95% 90%   Right foot is unchanged.  LABS: Lab Results  Component Value Date   WBC 5.7 04/18/2014   HGB 12.4 04/18/2014   HCT 37.6 04/18/2014   MCV 95.4 04/18/2014   PLT 328 04/18/2014   Lab Results  Component Value Date   CREATININE 1.64* 04/18/2014   Lab Results  Component Value Date   INR 1.18 04/18/2014   Active Problems:   Gangrene associated with diabetes mellitus    Gae Gallop Beeper: [phone] 04/18/2014

## 2014-04-18 NOTE — H&P (Signed)
[facility]  Patient name: Julia Goodman MRN: [id number] DOB: 03-Jun-1925 Sex: female  REASON FOR ADMISSION: gangrene of fourth and third toes of right foot  HPI: Julia Goodman is a 81 y.o. female who approximately 3 weeks ago injured her right foot on a wheelchair. She developed a blister and wounds on the third and fourth toes of the right foot. She was seen by her podiatrist today. He discussed the case with Dr. Oneida Alar. Dr. Oneida Alar recommended that the patient come to [facility]. For this reason vascular surgery was consult.  Dr. Oneida Alar performed previous iliac angioplasty on the left side in 2012. He has followed her peripheral vascular disease. I do not get any history of claudication although her activity is fairly limited. He does have pain in her toes but no classic rest pain in her feet. She denies wounds on the left foot. She denies fever or chills.  She was last seen in our office by Dr. Oneida Alar on 07/11/2013. She was being followed with chronic ulcers on her left foot. These were significantly improved. It was felt that if the wounds did not improve she could undergo arteriography although she was debilitated and not a very good candidate for revascularization.  Past Medical History  Diagnosis Date  . Diabetes mellitus   . Leg pain   . COPD (chronic obstructive pulmonary disease)   . Cardiomyopathy   . Irregular heartbeat   . Coronary artery disease   . Myocardial infarction   . Peripheral vascular disease   . Cancer     Skin Melanoma  . CHF (congestive heart failure)   . Arthritis     OSTEOARTHRITIS    Family History  Problem Relation Age of Onset  . Diabetes Other   . COPD Mother   . Cancer Father   . Diabetes Sister   . Hyperlipidemia Daughter   . Hypertension Daughter   . Hyperlipidemia Son   . Hypertension Son     SOCIAL HISTORY: History  Substance Use Topics  . Smoking status: Former Smoker -- 76 years   Types: Cigarettes    Quit date: 11/04/1999  . Smokeless tobacco: Never Used  . Alcohol Use: No    Allergies  Allergen Reactions  . Hydrocodone Anaphylaxis  . Ace Inhibitors Other (See Comments)  . Ciprofloxacin   . Ketamine Other (See Comments)    halluciantion  . Niaspan [Niacin Er] Other (See Comments)    jitters  . Zetia [Ezetimibe]     Current Facility-Administered Medications  Medication Dose Route Frequency Provider Last Rate Last Dose  . insulin aspart (novoLOG) injection 0-15 Units  0-15 Units Subcutaneous TID WC Angelia Mould, MD      . vancomycin (VANCOCIN) IVPB 1000 mg/200 mL premix  1,000 mg Intravenous Once Wandra Arthurs, MD       Current Outpatient Prescriptions  Medication Sig Dispense Refill  . acetaminophen-codeine (TYLENOL #3) 300-30 MG per tablet Take 1 tablet by mouth every 4 (four) hours as needed.      Marland Kitchen amitriptyline (ELAVIL) 25 MG tablet Take 25 mg by mouth at bedtime.    Marland Kitchen AMITRIPTYLINE HCL PO Take 25 mg by mouth daily.     Marland Kitchen apixaban (ELIQUIS) 2.5 MG TABS tablet Take 2.5 mg by mouth 2 (two) times daily.    . Calcium Carbonate-Vitamin D (OSCAL 500/200 D-3 PO) Take 1 tablet by mouth daily.     . citalopram (CELEXA) 10 MG  tablet Take 5 mg by mouth daily.    Marland Kitchen donepezil (ARICEPT) 10 MG tablet Take 10 mg by mouth at bedtime.    . ergocalciferol (VITAMIN D2) 50000 UNITS capsule Take 50,000 Units by mouth once a week.      . fenofibrate 160 MG tablet Take 160 mg by mouth daily.    . fexofenadine (ALLEGRA) 180 MG tablet Take 180 mg by mouth daily.    . fish oil-omega-3 fatty acids 1000 MG capsule Take 2 g by mouth daily.      . fluticasone (FLONASE) 50 MCG/ACT nasal spray Place 1 spray into both nostrils daily.    . furosemide (LASIX) 20 MG tablet Take 20 mg by mouth every other day.    . Ginkgo Biloba (GINKGO PO) Take 1 tablet by mouth daily.     Marland Kitchen glipiZIDE (GLUCOTROL) 5 MG tablet Take 5 mg by mouth 2 (two) times daily before a meal. Takes 2 tablets  twice daily.    . Multiple Vitamin (MULTIVITAMIN) capsule Take 0.5 capsules by mouth 2 (two) times daily.     . nabumetone (RELAFEN) 500 MG tablet Take 500 mg by mouth 2 (two) times daily.    . nitroGLYCERIN (NITROSTAT) 0.4 MG SL tablet Place 0.4 mg under the tongue every 5 (five) minutes as needed for chest pain.    . pravastatin (PRAVACHOL) 80 MG tablet Take 80 mg by mouth daily.    . sitaGLIPtin (JANUVIA) 100 MG tablet Take 100 mg by mouth daily.    . sotalol (BETAPACE) 80 MG tablet Take 80 mg by mouth 2 (two) times daily.    Marland Kitchen CARVEDILOL PO Take 6.25 mg by mouth daily.     Marland Kitchen DILTIAZEM HCL PO Take 120 mg by mouth daily.     . Fenofibrate (TRICOR PO) Take 160 mg by mouth daily.     Marland Kitchen FOLIC ACID PO Take by mouth.      . GLYBURIDE PO Take 5 mg by mouth.     . Ipratropium-Albuterol (DUONEB IN) Inhale into the lungs.      . Loratadine (CLARITIN PO) Take by mouth.      . METFORMIN HCL PO Take by mouth.      . NABUMETONE PO Take by mouth.      . Nitroglycerin (MINITRAN TD) Place onto the skin.        REVIEW OF SYSTEMS: Valu.Nieves ] denotes positive finding; [  ] denotes negative finding CARDIOVASCULAR:  [ ]  chest pain   [ ]  chest pressure   [ ]  palpitations   [ ]  orthopnea   Valu.Nieves ] dyspnea on exertion   [ ]  claudication   [ ]  rest pain   [ ]  DVT   [ ]  phlebitis PULMONARY:   [ ]  productive cough   [ ]  asthma   [ ]  wheezing NEUROLOGIC:   [ ]  weakness  [ ]  paresthesias  [ ]  aphasia  [ ]  amaurosis  [ ]  dizziness HEMATOLOGIC:   [ ]  bleeding problems   [ ]  clotting disorders MUSCULOSKELETAL:  [X joint pain   [ ]  joint swelling [ ]  leg swelling GASTROINTESTINAL: [ ]   blood in stool  [ ]   hematemesis GENITOURINARY:  [ ]   dysuria  [ ]   hematuria PSYCHIATRIC:  [ ]  history of major depression INTEGUMENTARY:  [ ]  rashes  [X]  ulcers CONSTITUTIONAL:  [ ]  fever   [ ]  chills  PHYSICAL EXAM: Filed Vitals:   04/17/14 2128 04/17/14 2329 04/17/14 2330  04/17/14 2354  BP: 152/50   151/62  Pulse: 49 55 59 59  Temp:  97.5 F (36.4 C)     TempSrc: Oral     Resp: 20 19 17 19   Height: 5\' 6"  (1.676 m)     Weight: 205 lb (92.987 kg)     SpO2: 96% 99% 99% 99%   Body mass index is 33.1 kg/(m^2). GENERAL: The patient is a well-nourished female, in no acute distress. The vital signs are documented above. CARDIOVASCULAR: There is a regular rate and rhythm. I do not to take carotid bruits. I cannot palpate a right femoral pulse. She does have a palpable left femoral pulse. I cannot palpate pedal pulses. PULMONARY: There is good air exchange bilaterally without wheezing or rales. ABDOMEN: Soft and non-tender with normal pitched bowel sounds.  MUSCULOSKELETAL: There are no major deformities or cyanosis. NEUROLOGIC: No focal weakness or paresthesias are detected. SKIN: She has a blister and wound that involves the base of her right heard and fourth toes. There is mild cellulitis. PSYCHIATRIC: The patient has a normal affect.  DATA:  Lab Results  Component Value Date   WBC 4.3 04/17/2014   HGB 12.4 04/17/2014   HCT 38.2 04/17/2014   MCV 95.0 04/17/2014   PLT 348 04/17/2014   Lab Results  Component Value Date   NA 138 04/17/2014   K 4.9 04/17/2014   CL 105 04/17/2014   CO2 26 04/17/2014   Lab Results  Component Value Date   CREATININE 1.89* 04/17/2014   MEDICAL ISSUES:  ATHEROSCLEROSIS WITH ULCERATION RIGHT FOOT: This patient presents with wounds involving her right third and fourth toes with gangrene and cellulitis. She has evidence of multilevel arterial occlusive disease on exam. She is on Eliquis which I have held. She is diabetic. When she has been off of her Eliquis she can be said considered for arteriography to evaluate her options for revascularization. Given her age, certainly she would be at increased risk for intervention. However, given the wounds on her right foot with diabetes and multilevel arterial occlusive disease this is clearly a limb threatening situation. She will be admitted and  placed on intravenous vancomycin and Zosyn.  [place] [facility] Beeper: [phone]

## 2014-04-18 NOTE — Progress Notes (Signed)
Do to the acuity of this unit, medications were not administered at scheduled times. Pharmacy notified to adjust times of medications. Family aware.

## 2014-04-18 NOTE — Progress Notes (Addendum)
ANTIBIOTIC CONSULT NOTE - INITIAL  Pharmacy Consult for Vancomycin and Zosyn Indication: Cellulitis  Allergies  Allergen Reactions  . Hydrocodone Anaphylaxis  . Ace Inhibitors Other (See Comments)  . Ciprofloxacin   . Ketamine Other (See Comments)    halluciantion  . Niaspan [Niacin Er] Other (See Comments)    jitters  . Zetia [Ezetimibe]     Patient Measurements: Height: 5\' 6"  (167.6 cm) Weight: 205 lb (92.987 kg) IBW/kg (Calculated) : 59.3   Vital Signs: Temp: 97.7 F (36.5 C) (03/11 0217) Temp Source: Oral (03/11 0217) BP: 164/66 mmHg (03/11 0217) Pulse Rate: 61 (03/11 0217) Intake/Output from previous day:   Intake/Output from this shift:    Labs:  Recent Labs  04/17/14 2142  WBC 4.3  HGB 12.4  PLT 348  CREATININE 1.89*   Estimated Creatinine Clearance: 24.6 mL/min (by C-G formula based on Cr of 1.89). No results for input(s): VANCOTROUGH, VANCOPEAK, VANCORANDOM, GENTTROUGH, GENTPEAK, GENTRANDOM, TOBRATROUGH, TOBRAPEAK, TOBRARND, AMIKACINPEAK, AMIKACINTROU, AMIKACIN in the last 72 hours.   Microbiology: No results found for this or any previous visit (from the past 720 hour(s)).  Medical History: Past Medical History  Diagnosis Date  . Diabetes mellitus   . Leg pain   . COPD (chronic obstructive pulmonary disease)   . Cardiomyopathy   . Irregular heartbeat   . Coronary artery disease   . Myocardial infarction   . Peripheral vascular disease   . Cancer     Skin Melanoma  . CHF (congestive heart failure)   . Arthritis     OSTEOARTHRITIS    Medications:  Prescriptions prior to admission  Medication Sig Dispense Refill Last Dose  . acetaminophen-codeine (TYLENOL #3) 300-30 MG per tablet Take 1 tablet by mouth every 4 (four) hours as needed.     04/17/2014 at Unknown time  . amitriptyline (ELAVIL) 25 MG tablet Take 25 mg by mouth at bedtime.   04/16/2014 at Unknown time  . AMITRIPTYLINE HCL PO Take 25 mg by mouth daily.    04/17/2014 at Unknown time   . apixaban (ELIQUIS) 2.5 MG TABS tablet Take 2.5 mg by mouth 2 (two) times daily.   04/17/2014 at Unknown time  . Calcium Carbonate-Vitamin D (OSCAL 500/200 D-3 PO) Take 1 tablet by mouth daily.    04/17/2014 at Unknown time  . citalopram (CELEXA) 10 MG tablet Take 5 mg by mouth daily.   04/17/2014 at Unknown time  . donepezil (ARICEPT) 10 MG tablet Take 10 mg by mouth at bedtime.   04/16/2014 at Unknown time  . ergocalciferol (VITAMIN D2) 50000 UNITS capsule Take 50,000 Units by mouth once a week.     04/08/2014  . fenofibrate 160 MG tablet Take 160 mg by mouth daily.   04/17/2014 at Unknown time  . fexofenadine (ALLEGRA) 180 MG tablet Take 180 mg by mouth daily.   04/16/2014 at Unknown time  . fish oil-omega-3 fatty acids 1000 MG capsule Take 2 g by mouth daily.     04/16/2014 at Unknown time  . fluticasone (FLONASE) 50 MCG/ACT nasal spray Place 1 spray into both nostrils daily.   unknown  . furosemide (LASIX) 20 MG tablet Take 20 mg by mouth every other day.   04/16/2014 at Unknown time  . Ginkgo Biloba (GINKGO PO) Take 1 tablet by mouth daily.    04/17/2014 at Unknown time  . glipiZIDE (GLUCOTROL) 5 MG tablet Take 5 mg by mouth 2 (two) times daily before a meal. Takes 2 tablets twice daily.  04/17/2014 at Unknown time  . Multiple Vitamin (MULTIVITAMIN) capsule Take 0.5 capsules by mouth 2 (two) times daily.    04/17/2014 at Unknown time  . nabumetone (RELAFEN) 500 MG tablet Take 500 mg by mouth 2 (two) times daily.   04/17/2014 at Unknown time  . nitroGLYCERIN (NITROSTAT) 0.4 MG SL tablet Place 0.4 mg under the tongue every 5 (five) minutes as needed for chest pain.   unknown  . pravastatin (PRAVACHOL) 80 MG tablet Take 80 mg by mouth daily.   04/16/2014 at Unknown time  . sitaGLIPtin (JANUVIA) 100 MG tablet Take 100 mg by mouth daily.   04/17/2014 at Unknown time  . sotalol (BETAPACE) 80 MG tablet Take 80 mg by mouth 2 (two) times daily.   04/17/2014 at Unknown time  . CARVEDILOL PO Take 6.25 mg by mouth daily.     Taking  . DILTIAZEM HCL PO Take 120 mg by mouth daily.    Taking  . Fenofibrate (TRICOR PO) Take 160 mg by mouth daily.    Taking  . FOLIC ACID PO Take by mouth.     Taking  . GLYBURIDE PO Take 5 mg by mouth.    Taking  . Ipratropium-Albuterol (DUONEB IN) Inhale into the lungs.     Taking  . Loratadine (CLARITIN PO) Take by mouth.     Taking  . METFORMIN HCL PO Take by mouth.     Taking  . NABUMETONE PO Take by mouth.     Taking  . Nitroglycerin (MINITRAN TD) Place onto the skin.     Taking   Scheduled:  . amitriptyline  25 mg Oral QHS  . calcium-vitamin D  1 tablet Oral Daily  . carvedilol  6.25 mg Oral Q breakfast  . citalopram  5 mg Oral Daily  . diltiazem  120 mg Oral Daily  . donepezil  10 mg Oral QHS  . enoxaparin (LOVENOX) injection  40 mg Subcutaneous Q24H  . fenofibrate  160 mg Oral Daily  . fluticasone  1 spray Each Nare Daily  . folic acid  1 mg Oral Daily  . furosemide  20 mg Oral QODAY  . glipiZIDE  5 mg Oral BID AC  . insulin aspart  0-15 Units Subcutaneous TID WC  . linagliptin  5 mg Oral Daily  . loratadine  10 mg Oral Daily  . multivitamin with minerals  0.5 tablet Oral BID  . nabumetone  500 mg Oral BID  . nitroGLYCERIN  0.1 mg Transdermal Daily  . omega-3 acid ethyl esters  1 g Oral Daily  . pantoprazole  40 mg Oral Daily  . potassium chloride  20-40 mEq Oral Once  . pravastatin  80 mg Oral q1800  . sotalol  80 mg Oral BID  . [START ON 04/19/2014] Vitamin D (Ergocalciferol)  50,000 Units Oral Q Sat   Assessment: 81 y.o female here with R 3rd and 4th toe ulcer with cellulitis. Xray showed no obvious osteomyelitis.  Received 1st dose of zosyn 3.375 gm and vancomycin 1000 mg in ED.   Goal of Therapy:  Vancomycin trough level 10-15 mcg/ml  Plan:  Zosyn 3.375 gm IV q8h Vancomycin 750 mg IV q24h Monitor clinical status, renal function daily.  Vancomycin trough at steady state prn per protocol.   Nicole Cella, RPh Clinical Pharmacist Pager:  [phone] 04/18/2014,2:25 AM   Addendum: Renal function slightly improved. Increase vancomycin to 1000 mg IV q24h.  [place], Pharm.D., BCPS Clinical Pharmacist Pager: [phone] 04/18/2014 11:57 AM

## 2014-04-18 NOTE — Progress Notes (Signed)
ANTICOAGULATION CONSULT NOTE - Initial Consult  Pharmacy Consult for heparin Indication: atrial fibrillation  Allergies  Allergen Reactions  . Hydrocodone Anaphylaxis  . Ace Inhibitors Other (See Comments)  . Ciprofloxacin   . Ketamine Other (See Comments)    halluciantion  . Niaspan [Niacin Er] Other (See Comments)    jitters  . Zetia [Ezetimibe]     Patient Measurements: Height: 5\' 6"  (167.6 cm) Weight: 205 lb (92.987 kg) IBW/kg (Calculated) : 59.3 Heparin Dosing Weight: 80 kg  Vital Signs: Temp: 97.8 F (36.6 C) (03/11 0441) Temp Source: Oral (03/11 0217) BP: 157/123 mmHg (03/11 0441) Pulse Rate: 64 (03/11 0441)  Labs:  Recent Labs  04/17/14 2142 04/18/14 0323  HGB 12.4 12.4  HCT 38.2 37.6  PLT 348 328  LABPROT  --  15.1  INR  --  1.18  CREATININE 1.89* 1.64*    Estimated Creatinine Clearance: 28.3 mL/min (by C-G formula based on Cr of 1.64).   Medical History: Past Medical History  Diagnosis Date  . Diabetes mellitus   . Leg pain   . COPD (chronic obstructive pulmonary disease)   . Cardiomyopathy   . Irregular heartbeat   . Coronary artery disease   . Myocardial infarction   . Peripheral vascular disease   . Cancer     Skin Melanoma  . CHF (congestive heart failure)   . Arthritis     OSTEOARTHRITIS    Medications:  Prescriptions prior to admission  Medication Sig Dispense Refill Last Dose  . acetaminophen-codeine (TYLENOL #3) 300-30 MG per tablet Take 1 tablet by mouth every 4 (four) hours as needed (pain).    04/17/2014 at Unknown time  . amitriptyline (ELAVIL) 25 MG tablet Take 25 mg by mouth at bedtime.   04/16/2014 at Unknown time  . apixaban (ELIQUIS) 2.5 MG TABS tablet Take 2.5 mg by mouth 2 (two) times daily.   04/17/2014 at am  . Calcium Carbonate-Vitamin D (OSCAL 500/200 D-3 PO) Take 1 tablet by mouth daily with supper.    04/17/2014 at Unknown time  . donepezil (ARICEPT) 10 MG tablet Take 10 mg by mouth at bedtime.   04/16/2014 at Unknown  time  . ergocalciferol (VITAMIN D2) 50000 UNITS capsule Take 50,000 Units by mouth once a week.     04/08/2014  . escitalopram (LEXAPRO) 10 MG tablet Take 5 mg by mouth 2 (two) times daily.   04/17/2014 at Unknown time  . fenofibrate 160 MG tablet Take 160 mg by mouth every morning.    04/17/2014 at Unknown time  . fexofenadine (ALLEGRA) 180 MG tablet Take 180 mg by mouth daily.   04/16/2014 at Unknown time  . fish oil-omega-3 fatty acids 1000 MG capsule Take 1 g by mouth every morning.    04/16/2014 at Unknown time  . fluticasone (FLONASE) 50 MCG/ACT nasal spray Place 1 spray into both nostrils daily as needed for allergies.    unknown  . FOLIC ACID PO Take 1 tablet by mouth every morning.   04/17/2014 at Unknown time  . furosemide (LASIX) 20 MG tablet Take 20 mg by mouth every other day.   04/16/2014 at Unknown time  . Ginkgo Biloba (GINKGO PO) Take 1 tablet by mouth daily with supper.    04/17/2014 at Unknown time  . glipiZIDE (GLUCOTROL) 5 MG tablet Take 10 mg by mouth 2 (two) times daily before a meal. Takes 2 tablets twice daily.   04/17/2014 at Unknown time  . GLUCOSAMINE-CHONDROITIN PO Take 1 tablet by mouth  daily with supper.   Past Week at Unknown time  . ipratropium-albuterol (DUONEB) 0.5-2.5 (3) MG/3ML SOLN Take 3 mLs by nebulization 2 (two) times daily as needed (shortness of breath).   not recent  . Multiple Vitamin (MULTIVITAMIN WITH MINERALS) TABS tablet Take 0.5 tablets by mouth 2 (two) times daily.   04/17/2014 at Unknown time  . nabumetone (RELAFEN) 500 MG tablet Take 500 mg by mouth 3 (three) times daily.    04/17/2014 at Unknown time  . nitroGLYCERIN (NITRODUR - DOSED IN MG/24 HR) 0.4 mg/hr patch Place 0.4 mg onto the skin every morning.   04/17/2014 at Unknown time  . nitroGLYCERIN (NITROSTAT) 0.4 MG SL tablet Place 0.4 mg under the tongue every 5 (five) minutes as needed for chest pain.   unknown  . pravastatin (PRAVACHOL) 80 MG tablet Take 80 mg by mouth every evening.    04/16/2014 at Unknown  time  . sitaGLIPtin (JANUVIA) 100 MG tablet Take 100 mg by mouth every morning.    04/17/2014 at Unknown time  . sotalol (BETAPACE) 80 MG tablet Take 80 mg by mouth 2 (two) times daily.   04/17/2014 at 1000  . Vitamin D, Ergocalciferol, (DRISDOL) 50000 UNITS CAPS capsule Take 50,000 Units by mouth See admin instructions. 5 times monthly   04/15/2014    Assessment: 81 y/o female with PVD who presented to the ED on 3/10 with right 3rd and 4 th toe infection/gangrene. She was started on antibiotics for the infection. She takes apixaban chronically for Afib and this has been held for possible surgery. Pharmacy consulted to begin IV heparin. Last dose of apixaban was 3/10 in the morning. Lovenox for VTE prophylaxis ordered but no dose charted as given. No bleeding noted, CBC is normal.  Goal of Therapy:  Heparin level 0.3-0.7 units/ml aPTT 66-102 seconds Monitor platelets by anticoagulation protocol: Yes   Plan:  - Baseline heparin level and aPTT now - Begin heparin drip at 1050 units/hr with no bolus - 8 hr heparin level and aPTT - Daily heparin level and aPTT (until aPTT and heparin levels correlate then just heparin level) - Monitor for s/sx of bleeding  [facility], [place].D., BCPS Clinical Pharmacist Pager: [phone] 04/18/2014 1:22 PM

## 2014-04-18 NOTE — Progress Notes (Signed)
PHARMACIST - PHYSICIAN COMMUNICATION DR:   CONCERNING:  METFORMIN SAFE ADMINISTRATION POLICY  RECOMMENDATION: Metformin has been placed on DISCONTINUE (rejected order) STATUS and should be reordered only after any of the conditions below are ruled out.  Current safety recommendations include avoiding metformin for a minimum of 48 hours after the patient's exposure to intravenous contrast media.  DESCRIPTION:  The Pharmacy Committee has adopted a policy that restricts the use of metformin in hospitalized patients until all the contraindications to administration have been ruled out. Specific contraindications are: []  Serum creatinine ? 1.5 for males [x]  Serum creatinine ? 1.4 for females (currently 1.89) []  Shock, acute MI, sepsis, hypoxemia, dehydration []  Planned administration of intravenous iodinated contrast media []  Heart Failure patients with low EF []  Acute or chronic metabolic acidosis (including DKA)     Also holding gingko until discharged 2/2 herbal policy.

## 2014-04-18 NOTE — Progress Notes (Signed)
Utilization Review Completed.Donne Anon T3/12/2014

## 2014-04-18 NOTE — Progress Notes (Signed)
INITIAL NUTRITION ASSESSMENT  DOCUMENTATION CODES Per approved criteria  -Obesity Unspecified   INTERVENTION: Continue Glucerna Shake po TID, each supplement provides 220 kcal and 10 grams of protein.  Discontinue Ensure.  Encourage adequate PO intake.  NUTRITION DIAGNOSIS: Increased nutrient needs related to chronic illness, foot infection as evidenced by estimated nutrition needs.   Goal: Pt to meet >/= 90% of their estimated nutrition needs   Monitor:  PO intake, weight trends, labs, I/O's  Reason for Assessment: MST  81 y.o. female  Admitting Dx: Gangrene of right foot   ASSESSMENT: Pt who approximately 3 weeks ago injured her right foot on a wheelchair. She developed a blister and wounds on the third and fourth toes of the right foot. Pt with gangrene of fourth and third toes of right foot. Pt with PMH of DM, COPD, CAD, CHF, skin melanoma.  Pt reports having a great appetite currently and PTA at home. Pt did not eat her breakfast this AM and she reported to be extremely tired and just wanted to sleep. Daughter at bedside. She reports pt's usual body weight of 200 lbs. Pt is agreeable to nutrition shakes to aid in healing. Pt currently has Glucerna shake ordered TID and Ensure ordered BID. RD to discontinue Ensure. Will continue with Glucerna Shake. Pt was encouraged to eat her food at meals and to drink her supplements.  Pt with no observe significant fat or muscle mass loss.  Labs: Low GFR. High BUN and creatinine.  Height: Ht Readings from Last 1 Encounters:  04/17/14 5\' 6"  (1.676 m)    Weight: Wt Readings from Last 1 Encounters:  04/17/14 205 lb (92.987 kg)    Ideal Body Weight: 130 lbs  % Ideal Body Weight: 158%  Wt Readings from Last 10 Encounters:  04/17/14 205 lb (92.987 kg)  07/11/13 212 lb (96.163 kg)  05/30/13 212 lb (96.163 kg)  05/19/11 232 lb (105.235 kg)  11/18/10 233 lb (105.688 kg)  11/04/10 233 lb (105.688 kg)    Usual Body Weight:  200 lbs  % Usual Body Weight: 103%  BMI:  Body mass index is 33.1 kg/(m^2). Class I obesity  Estimated Nutritional Needs: Kcal: 1900-2100 Protein: 100-115 grams Fluid: 1.9 - 2.1 L/day  Skin: +1 RLE edema  Diet Order: Diet Carb Modified  EDUCATION NEEDS: -No education needs identified at this time   Intake/Output Summary (Last 24 hours) at 04/18/14 1111 Last data filed at 04/18/14 0700  Gross per 24 hour  Intake    470 ml  Output      0 ml  Net    470 ml    Last BM: PTA  Labs:   Recent Labs Lab 04/17/14 2142 04/18/14 0323  NA 138 139  K 4.9 5.0  CL 105 107  CO2 26 22  BUN 48* 42*  CREATININE 1.89* 1.64*  CALCIUM 9.5 8.9  GLUCOSE 222* 129*    CBG (last 3)   Recent Labs  04/18/14 0758  GLUCAP 137*    Scheduled Meds: . amitriptyline  25 mg Oral QHS  . calcium-vitamin D  1 tablet Oral Daily  . carvedilol  6.25 mg Oral Q breakfast  . citalopram  5 mg Oral Daily  . diltiazem  120 mg Oral Daily  . donepezil  10 mg Oral QHS  . enoxaparin (LOVENOX) injection  40 mg Subcutaneous Q24H  . feeding supplement (ENSURE COMPLETE)  237 mL Oral BID BM  . fenofibrate  160 mg Oral Daily  . fluticasone  1 spray Each Nare Daily  . folic acid  1 mg Oral Daily  . furosemide  20 mg Oral QODAY  . glipiZIDE  5 mg Oral BID AC  . insulin aspart  0-15 Units Subcutaneous TID WC  . linagliptin  5 mg Oral Daily  . loratadine  10 mg Oral Daily  . multivitamin with minerals  0.5 tablet Oral BID  . nabumetone  500 mg Oral BID  . nitroGLYCERIN  0.1 mg Transdermal Daily  . omega-3 acid ethyl esters  1 g Oral Daily  . pantoprazole  40 mg Oral Daily  . piperacillin-tazobactam (ZOSYN)  IV  3.375 g Intravenous 3 times per day  . potassium chloride  20-40 mEq Oral Once  . pravastatin  80 mg Oral q1800  . sotalol  80 mg Oral BID  . vancomycin  750 mg Intravenous Q24H  . [START ON 04/19/2014] Vitamin D (Ergocalciferol)  50,000 Units Oral Q Sat    Continuous Infusions: . 0.9 %  NaCl with KCl 20 mEq / L 50 mL/hr at 04/18/14 0700    Past Medical History  Diagnosis Date  . Diabetes mellitus   . Leg pain   . COPD (chronic obstructive pulmonary disease)   . Cardiomyopathy   . Irregular heartbeat   . Coronary artery disease   . Myocardial infarction   . Peripheral vascular disease   . Cancer     Skin Melanoma  . CHF (congestive heart failure)   . Arthritis     OSTEOARTHRITIS    Past Surgical History  Procedure Laterality Date  . Melanoma excision    . Finger surgery    . Aortogram with stenting  11-05-10    Left CIA stent  . Fracture surgery  Feb. 2013    Spine  . Cholecystectomy      Gall Bladder    Kallie Locks, MS, RD, LDN Pager # [phone] After hours/ weekend pager # [phone]

## 2014-04-18 NOTE — Consult Note (Addendum)
Consult note  Julia Goodman:[id number] DOB: 06-26-1925 DOA: 04/17/2014  Requesting physician: Dr. Scot Dock Consulting physician: [facility] Dr. Erlinda Hong PCP: Moshe Cipro, MD   Chief Complaint: left foot pain  HPI: Julia Goodman is a 81 y.o. female  Julia Goodman is a 81 y.o. female who approximately 3 weeks ago injured her right foot on a wheelchair. She developed a blister and wounds on the third and fourth toes of the right foot. She was seen by her podiatrist 3/10. He discussed the case with Dr. Oneida Alar. Dr. Oneida Alar recommended that the [facility] come [facility].  Dr. Oneida Alar performed previous iliac angioplasty on the left side in 2012. He has followed her peripheral vascular disease.she denies fever or chills.  She was last seen in our office by Dr. Oneida Alar on 07/11/2013. She was being followed with chronic ulcers on her left foot. These were significantly improved. It was felt that if the wounds did not improve she could undergo arteriography although she was debilitated and not a very good candidate for revascularization.  Patient is admitted to vascular surgery, hospitalist consulted for medical management.   Review of Systems:  On chronic home oxygen 3liter for copd, currently denies headache, no cough, no sob, no n/v, no ab pain. No fever, no chronic edema. Right foot wound as noted in HPI, Review of systems are otherwise negative  Past Medical History  Diagnosis Date  . Diabetes mellitus   . Leg pain   . COPD (chronic obstructive pulmonary disease)   . Cardiomyopathy   . Irregular heartbeat   . Coronary artery disease   . Myocardial infarction   . Peripheral vascular disease   . Cancer     Skin Melanoma  . CHF (congestive heart failure)   . Arthritis     OSTEOARTHRITIS   Past Surgical History  Procedure Laterality Date  . Melanoma excision    . Finger surgery    . Aortogram with stenting  11-05-10    Left CIA stent  . Fracture surgery  Feb. 2013      Spine  . Cholecystectomy      Gall Bladder   Social History:  reports that she quit smoking about 14 years ago. Her smoking use included Cigarettes. She quit after 55 years of use. She has never used smokeless tobacco. She reports that she does not drink alcohol or use illicit drugs. Patient lives at home & is able to participate in activities of daily living, does use wheelchair  Allergies  Allergen Reactions  . Hydrocodone Anaphylaxis  . Ace Inhibitors Other (See Comments)  . Ciprofloxacin   . Ketamine Other (See Comments)    halluciantion  . Niaspan [Niacin Er] Other (See Comments)    jitters  . Zetia [Ezetimibe]     Family History  Problem Relation Age of Onset  . Diabetes Other   . COPD Mother   . Cancer Father   . Diabetes Sister   . Hyperlipidemia Daughter   . Hypertension Daughter   . Hyperlipidemia Son   . Hypertension Son       Prior to Admission medications   Medication Sig Start Date End Date Taking? Authorizing Provider  acetaminophen-codeine (TYLENOL #3) 300-30 MG per tablet Take 1 tablet by mouth every 4 (four) hours as needed.     Yes Historical Provider, MD  amitriptyline (ELAVIL) 25 MG tablet Take 25 mg by mouth at bedtime.   Yes Historical Provider, MD  AMITRIPTYLINE HCL PO Take  25 mg by mouth daily.    Yes Historical Provider, MD  apixaban (ELIQUIS) 2.5 MG TABS tablet Take 2.5 mg by mouth 2 (two) times daily.   Yes Historical Provider, MD  Calcium Carbonate-Vitamin D (OSCAL 500/200 D-3 PO) Take 1 tablet by mouth daily.    Yes Historical Provider, MD  citalopram (CELEXA) 10 MG tablet Take 5 mg by mouth daily.   Yes Historical Provider, MD  donepezil (ARICEPT) 10 MG tablet Take 10 mg by mouth at bedtime.   Yes Historical Provider, MD  ergocalciferol (VITAMIN D2) 50000 UNITS capsule Take 50,000 Units by mouth once a week.     Yes Historical Provider, MD  fenofibrate 160 MG tablet Take 160 mg by mouth daily.   Yes Historical Provider, MD  fexofenadine  (ALLEGRA) 180 MG tablet Take 180 mg by mouth daily.   Yes Historical Provider, MD  fish oil-omega-3 fatty acids 1000 MG capsule Take 2 g by mouth daily.     Yes Historical Provider, MD  fluticasone (FLONASE) 50 MCG/ACT nasal spray Place 1 spray into both nostrils daily.   Yes Historical Provider, MD  furosemide (LASIX) 20 MG tablet Take 20 mg by mouth every other day.   Yes Historical Provider, MD  Ginkgo Biloba (GINKGO PO) Take 1 tablet by mouth daily.    Yes Historical Provider, MD  glipiZIDE (GLUCOTROL) 5 MG tablet Take 5 mg by mouth 2 (two) times daily before a meal. Takes 2 tablets twice daily.   Yes Historical Provider, MD  Multiple Vitamin (MULTIVITAMIN) capsule Take 0.5 capsules by mouth 2 (two) times daily.    Yes Historical Provider, MD  nabumetone (RELAFEN) 500 MG tablet Take 500 mg by mouth 2 (two) times daily.   Yes Historical Provider, MD  nitroGLYCERIN (NITROSTAT) 0.4 MG SL tablet Place 0.4 mg under the tongue every 5 (five) minutes as needed for chest pain.   Yes Historical Provider, MD  pravastatin (PRAVACHOL) 80 MG tablet Take 80 mg by mouth daily.   Yes Historical Provider, MD  sitaGLIPtin (JANUVIA) 100 MG tablet Take 100 mg by mouth daily.   Yes Historical Provider, MD  sotalol (BETAPACE) 80 MG tablet Take 80 mg by mouth 2 (two) times daily.   Yes Historical Provider, MD  CARVEDILOL PO Take 6.25 mg by mouth daily.     Historical Provider, MD  DILTIAZEM HCL PO Take 120 mg by mouth daily.     Historical Provider, MD  Fenofibrate (TRICOR PO) Take 160 mg by mouth daily.     Historical Provider, MD  FOLIC ACID PO Take by mouth.      Historical Provider, MD  GLYBURIDE PO Take 5 mg by mouth.     Historical Provider, MD  Ipratropium-Albuterol (DUONEB IN) Inhale into the lungs.      Historical Provider, MD  Loratadine (CLARITIN PO) Take by mouth.      Historical Provider, MD  METFORMIN HCL PO Take by mouth.      Historical Provider, MD  NABUMETONE PO Take by mouth.      Historical  Provider, MD  Nitroglycerin [facility] TD) Place onto the skin.      Historical Provider, MD    Physical Exam: BP 157/123 mmHg  Pulse 64  Temp(Src) 97.8 F (36.6 C) (Oral)  Resp 17  Ht 5\' 6"  (1.676 m)  Wt 92.987 kg (205 lb)  BMI 33.10 kg/m2  SpO2 90%  General:  AAox3, NAD Eyes: PERRL ENT: unremarkable Neck: supple, no JVD Cardiovascular: RRR,  No murmur Respiratory: mild crackles right lower base, cleared by deep inspiration, otherwise good airentry, no wheezing, no rhonchi Abdomen: soft/nd/nd, positive bowel sounds Skin: right foot erythema, otherwise on rash Musculoskeletal: right foot wound involving right third and fourth toe with gangrene/cellulitis/erythema/associated edema Psychiatric: calm and pleasant Neurologic: aaox3, no focal deficit          Labs on Admission:  Basic Metabolic Panel:  Recent Labs Lab 04/17/14 2142 04/18/14 0323  NA 138 139  K 4.9 5.0  CL 105 107  CO2 26 22  GLUCOSE 222* 129*  BUN 48* 42*  CREATININE 1.89* 1.64*  CALCIUM 9.5 8.9   Liver Function Tests:  Recent Labs Lab 04/18/14 0323  AST 23  ALT 25  ALKPHOS 52  BILITOT 0.6  PROT 7.1  ALBUMIN 2.8*   No results for input(s): LIPASE, AMYLASE in the last 168 hours. No results for input(s): AMMONIA in the last 168 hours. CBC:  Recent Labs Lab 04/17/14 2142 04/18/14 0323  WBC 4.3 5.7  HGB 12.4 12.4  HCT 38.2 37.6  MCV 95.0 95.4  PLT 348 328   Cardiac Enzymes: No results for input(s): CKTOTAL, CKMB, CKMBINDEX, TROPONINI in the last 168 hours.  BNP (last 3 results) No results for input(s): BNP in the last 8760 hours.  ProBNP (last 3 results) No results for input(s): PROBNP in the last 8760 hours.  CBG:  Recent Labs Lab 04/18/14 0758  GLUCAP 137*    Radiological Exams on Admission: Dg Foot Complete Right  04/17/2014   CLINICAL DATA:  Right foot swelling and drainage from second and fourth toes. Trauma last week. Initial encounter.  EXAM: RIGHT FOOT COMPLETE -  3+ VIEW  COMPARISON:  None.  FINDINGS: The bones are demineralized. No acute fracture, dislocation or bone destruction identified. There are mild degenerative changes at the first metatarsal phalangeal joint. There is generalized forefoot soft tissue swelling without evidence of foreign body or soft tissue emphysema. Scattered vascular and soft tissue calcifications are noted in the distal lower leg.  IMPRESSION: No acute osseous findings or radiographic evidence of osteomyelitis.   Electronically Signed   By: Richardean Sale M.D.   On: 04/17/2014 22:54    EKG: Independently reviewed. RRR  Assessment/Plan Present on Admission:  . Gangrene associated with diabetes mellitus  Gangrene right foot/PVD s/p stent: on vanc/zosyn, prn pain meds, h/o confusion with hydrocodone, reported tolerate morphine, on prn morphine for now. Plan per vascular surgery.  H/o PAF, s/p cardioversion, currently sinus rhythm, eliquis on hold, start heparin drip per pharmacy.  Continue sotalol/coreg/cardizem. Addendum: patient was not on coreg/cardizem at home, these were d/ed.  NIDDM2, check a1c, on ssi. Hold metformin   CVT prophylaxis: on full anticoagulation.  Code Status: full  Family Communication: patient and daughter  Disposition Plan: remain in patient  Time spent:  31mins  Marjani Kobel [facility] Pager [phone]

## 2014-04-19 DIAGNOSIS — I4891 Unspecified atrial fibrillation: Secondary | ICD-10-CM | POA: Diagnosis present

## 2014-04-19 DIAGNOSIS — N189 Chronic kidney disease, unspecified: Secondary | ICD-10-CM

## 2014-04-19 DIAGNOSIS — E1122 Type 2 diabetes mellitus with diabetic chronic kidney disease: Secondary | ICD-10-CM

## 2014-04-19 DIAGNOSIS — N183 Chronic kidney disease, stage 3 unspecified: Secondary | ICD-10-CM | POA: Diagnosis present

## 2014-04-19 DIAGNOSIS — E119 Type 2 diabetes mellitus without complications: Secondary | ICD-10-CM

## 2014-04-19 DIAGNOSIS — J449 Chronic obstructive pulmonary disease, unspecified: Secondary | ICD-10-CM | POA: Diagnosis present

## 2014-04-19 LAB — URINE CULTURE
Colony Count: NO GROWTH
Culture: NO GROWTH

## 2014-04-19 LAB — CBC
HCT: 34.3 % — ABNORMAL LOW (ref 36.0–46.0)
HEMOGLOBIN: 11 g/dL — AB (ref 12.0–15.0)
MCH: 30.6 pg (ref 26.0–34.0)
MCHC: 32.1 g/dL (ref 30.0–36.0)
MCV: 95.5 fL (ref 78.0–100.0)
Platelets: 293 10*3/uL (ref 150–400)
RBC: 3.59 MIL/uL — AB (ref 3.87–5.11)
RDW: 13.2 % (ref 11.5–15.5)
WBC: 4.5 10*3/uL (ref 4.0–10.5)

## 2014-04-19 LAB — BASIC METABOLIC PANEL
Anion gap: 6 (ref 5–15)
BUN: 28 mg/dL — ABNORMAL HIGH (ref 6–23)
CALCIUM: 8.9 mg/dL (ref 8.4–10.5)
CHLORIDE: 105 mmol/L (ref 96–112)
CO2: 28 mmol/L (ref 19–32)
CREATININE: 1.25 mg/dL — AB (ref 0.50–1.10)
GFR calc Af Amer: 44 mL/min — ABNORMAL LOW (ref >90)
GFR calc non Af Amer: 38 mL/min — ABNORMAL LOW (ref >90)
Glucose, Bld: 181 mg/dL — ABNORMAL HIGH (ref 70–99)
Potassium: 5.5 mmol/L — ABNORMAL HIGH (ref 3.5–5.1)
SODIUM: 139 mmol/L (ref 135–145)

## 2014-04-19 LAB — GLUCOSE, CAPILLARY
GLUCOSE-CAPILLARY: 101 mg/dL — AB (ref 70–99)
GLUCOSE-CAPILLARY: 131 mg/dL — AB (ref 70–99)
Glucose-Capillary: 208 mg/dL — ABNORMAL HIGH (ref 70–99)

## 2014-04-19 LAB — APTT
APTT: 64 s — AB (ref 24–37)
aPTT: 54 seconds — ABNORMAL HIGH (ref 24–37)

## 2014-04-19 LAB — HEMOGLOBIN A1C
Hgb A1c MFr Bld: 6.3 % — ABNORMAL HIGH (ref 4.8–5.6)
Mean Plasma Glucose: 134 mg/dL

## 2014-04-19 LAB — HEPARIN LEVEL (UNFRACTIONATED)
HEPARIN UNFRACTIONATED: 0.33 [IU]/mL (ref 0.30–0.70)
Heparin Unfractionated: 0.43 IU/mL (ref 0.30–0.70)

## 2014-04-19 MED ORDER — LEVALBUTEROL HCL 0.63 MG/3ML IN NEBU
0.6300 mg | INHALATION_SOLUTION | Freq: Four times a day (QID) | RESPIRATORY_TRACT | Status: DC | PRN
  Administered 2014-04-19 – 2014-04-29 (×6): 0.63 mg via RESPIRATORY_TRACT
  Filled 2014-04-19 (×7): qty 3

## 2014-04-19 MED ORDER — HEPARIN (PORCINE) IN NACL 100-0.45 UNIT/ML-% IJ SOLN
1400.0000 [IU]/h | INTRAMUSCULAR | Status: DC
  Administered 2014-04-19: 1400 [IU]/h via INTRAVENOUS
  Administered 2014-04-19: 1250 [IU]/h via INTRAVENOUS
  Filled 2014-04-19 (×2): qty 250

## 2014-04-19 MED ORDER — HEPARIN (PORCINE) IN NACL 100-0.45 UNIT/ML-% IJ SOLN
1700.0000 [IU]/h | INTRAMUSCULAR | Status: DC
  Administered 2014-04-20: 1500 [IU]/h via INTRAVENOUS
  Administered 2014-04-20: 1700 [IU]/h via INTRAVENOUS
  Filled 2014-04-19 (×5): qty 250

## 2014-04-19 NOTE — Progress Notes (Signed)
ANTICOAGULATION CONSULT NOTE - Follow up  Pharmacy Consult for heparin Indication: atrial fibrillation  Allergies  Allergen Reactions  . Hydrocodone Anaphylaxis  . Ace Inhibitors Other (See Comments)  . Ciprofloxacin   . Ketamine Other (See Comments)    halluciantion  . Niaspan [Niacin Er] Other (See Comments)    jitters  . Zetia [Ezetimibe]     Patient Measurements: Height: 5\' 6"  (167.6 cm) Weight: 205 lb (92.987 kg) IBW/kg (Calculated) : 59.3 Heparin Dosing Weight: 80 kg  Vital Signs:    Labs:  Recent Labs  04/17/14 2142 04/18/14 0323  04/18/14 2301 04/19/14 0620 04/19/14 1250 04/19/14 1640  HGB 12.4 12.4  --   --  11.0*  --   --   HCT 38.2 37.6  --   --  34.3*  --   --   PLT 348 328  --   --  293  --   --   APTT  --   --   < > 42* 54*  --  64*  LABPROT  --  15.1  --   --   --   --   --   INR  --  1.18  --   --   --   --   --   HEPARINUNFRC  --   --   < > 0.27* 0.33  --  0.43  CREATININE 1.89* 1.64*  --   --   --  1.25*  --   < > = values in this interval not displayed.  Estimated Creatinine Clearance: 37.1 mL/min (by C-G formula based on Cr of 1.25).   Medical History: Past Medical History  Diagnosis Date  . Diabetes mellitus   . Leg pain   . COPD (chronic obstructive pulmonary disease)   . Cardiomyopathy   . Irregular heartbeat   . Coronary artery disease   . Myocardial infarction   . Peripheral vascular disease   . Cancer     Skin Melanoma  . CHF (congestive heart failure)   . Arthritis     OSTEOARTHRITIS    Assessment: 42 yof continuing on heparin IV for afib. Takes apixaban chronically for Afib and has been held for possible surgery. Last dose of apixaban was 3/10 in the AM. Will adjust heparin rate based on aPTT until apixaban effect wears off. Hg slight decrease to 11, plt stable wnl. No bleeding documented.  Baseline heparin level = 0.4 and aPTT =34 seconds  Heparin infusing at 1400 units/hr. aPTT = 64 sec and HL = 0.43  PTT remains  slightly subtherapeutic, goal aPTT 66-102 seconds.  Hopefully could dose based solely on heparin level tomorrow.   Goal of Therapy:  Heparin level 0.3-0.7 units/ml aPTT 66-102 seconds Monitor platelets by anticoagulation protocol: Yes   Plan:  Increase heparin drip to 1500 units/hr  Recheck HL/aPTT with AM labs. Daily HL/aPTT (until aPTT and heparin levels correlate, then HL only) Monitor for s/sx of bleeding  Uvaldo Rising, BCPS  Clinical Pharmacist Pager [phone]  04/19/2014 5:40 PM

## 2014-04-19 NOTE — Progress Notes (Signed)
ANTICOAGULATION CONSULT NOTE - Follow up  Pharmacy Consult for heparin Indication: atrial fibrillation  Allergies  Allergen Reactions  . Hydrocodone Anaphylaxis  . Ace Inhibitors Other (See Comments)  . Ciprofloxacin   . Ketamine Other (See Comments)    halluciantion  . Niaspan [Niacin Er] Other (See Comments)    jitters  . Zetia [Ezetimibe]     Patient Measurements: Height: 5\' 6"  (167.6 cm) Weight: 205 lb (92.987 kg) IBW/kg (Calculated) : 59.3 Heparin Dosing Weight: 80 kg  Vital Signs: Temp: 97.9 F (36.6 C) (03/11 2049) Temp Source: Oral (03/11 2049) BP: 134/70 mmHg (03/11 2049) Pulse Rate: 56 (03/11 2049)  Labs:  Recent Labs  04/17/14 2142 04/18/14 0323 04/18/14 1257 04/18/14 2301  HGB 12.4 12.4  --   --   HCT 38.2 37.6  --   --   PLT 348 328  --   --   APTT  --   --  34 42*  LABPROT  --  15.1  --   --   INR  --  1.18  --   --   HEPARINUNFRC  --   --  0.40 0.27*  CREATININE 1.89* 1.64*  --   --     Estimated Creatinine Clearance: 28.3 mL/min (by C-G formula based on Cr of 1.64).   Medical History: Past Medical History  Diagnosis Date  . Diabetes mellitus   . Leg pain   . COPD (chronic obstructive pulmonary disease)   . Cardiomyopathy   . Irregular heartbeat   . Coronary artery disease   . Myocardial infarction   . Peripheral vascular disease   . Cancer     Skin Melanoma  . CHF (congestive heart failure)   . Arthritis     OSTEOARTHRITIS    Medications:  Prescriptions prior to admission  Medication Sig Dispense Refill Last Dose  . acetaminophen-codeine (TYLENOL #3) 300-30 MG per tablet Take 1 tablet by mouth every 4 (four) hours as needed (pain).    04/17/2014 at Unknown time  . amitriptyline (ELAVIL) 25 MG tablet Take 25 mg by mouth at bedtime.   04/16/2014 at Unknown time  . apixaban (ELIQUIS) 2.5 MG TABS tablet Take 2.5 mg by mouth 2 (two) times daily.   04/17/2014 at am  . Calcium Carbonate-Vitamin D (OSCAL 500/200 D-3 PO) Take 1 tablet by  mouth daily with supper.    04/17/2014 at Unknown time  . donepezil (ARICEPT) 10 MG tablet Take 10 mg by mouth at bedtime.   04/16/2014 at Unknown time  . escitalopram (LEXAPRO) 10 MG tablet Take 5 mg by mouth 2 (two) times daily.   04/17/2014 at Unknown time  . fenofibrate 160 MG tablet Take 160 mg by mouth every morning.    04/17/2014 at Unknown time  . fexofenadine (ALLEGRA) 180 MG tablet Take 180 mg by mouth daily.   04/16/2014 at Unknown time  . fish oil-omega-3 fatty acids 1000 MG capsule Take 1 g by mouth every morning.    04/16/2014 at Unknown time  . fluticasone (FLONASE) 50 MCG/ACT nasal spray Place 1 spray into both nostrils daily as needed for allergies.    unknown  . FOLIC ACID PO Take 1 tablet by mouth every morning.   04/17/2014 at Unknown time  . furosemide (LASIX) 20 MG tablet Take 20 mg by mouth every other day.   04/16/2014 at Unknown time  . Ginkgo Biloba (GINKGO PO) Take 1 tablet by mouth daily with supper.    04/17/2014 at Unknown time  .  glipiZIDE (GLUCOTROL) 5 MG tablet Take 10 mg by mouth 2 (two) times daily before a meal.    04/17/2014 at Unknown time  . GLUCOSAMINE-CHONDROITIN PO Take 1 tablet by mouth daily with supper.   Past Week at Unknown time  . ipratropium-albuterol (DUONEB) 0.5-2.5 (3) MG/3ML SOLN Take 3 mLs by nebulization 2 (two) times daily as needed (shortness of breath).   not recent  . Multiple Vitamin (MULTIVITAMIN WITH MINERALS) TABS tablet Take 0.5 tablets by mouth 2 (two) times daily.   04/17/2014 at Unknown time  . nabumetone (RELAFEN) 500 MG tablet Take 500 mg by mouth 3 (three) times daily.    04/17/2014 at Unknown time  . nitroGLYCERIN (NITRODUR - DOSED IN MG/24 HR) 0.4 mg/hr patch Place 0.4 mg onto the skin every morning.   04/17/2014 at Unknown time  . nitroGLYCERIN (NITROSTAT) 0.4 MG SL tablet Place 0.4 mg under the tongue every 5 (five) minutes as needed for chest pain.   unknown  . pravastatin (PRAVACHOL) 80 MG tablet Take 80 mg by mouth every evening.     04/16/2014 at Unknown time  . sitaGLIPtin (JANUVIA) 100 MG tablet Take 100 mg by mouth every morning.    04/17/2014 at Unknown time  . sotalol (BETAPACE) 80 MG tablet Take 80 mg by mouth 2 (two) times daily.   04/17/2014 at 1000  . Vitamin D, Ergocalciferol, (DRISDOL) 50000 UNITS CAPS capsule Take 50,000 Units by mouth See admin instructions. 5 times monthly   04/15/2014    Assessment: Baseline heparin level = 0.4 and aPTT =34 seconds. Heparin infusing at 1050 units/hr. After start of heparin the    ~6 hour aPTT =42 sec and HL = 0.27 in this 81 y/o female.  She takes apixaban chronically for Afib and this has been held for possible surgery.  Last dose of apixaban was 3/10 in the morning. Will adjust heparin rate based on aPTT until apixaban effect wears off.  Currently subtherapeutic, goal aPTT 66-102 seconds.      Goal of Therapy:  Heparin level 0.3-0.7 units/ml aPTT 66-102 seconds Monitor platelets by anticoagulation protocol: Yes   Plan:  Increase heparin drip to 1250 units/hr  6 hr heparin level and aPTT Daily heparin level and aPTT (until aPTT and heparin levels correlate then just heparin level) Monitor for s/sx of bleeding  Nicole Cella, RPh Clinical Pharmacist Pager: [phone] 04/19/2014 12:28 AM

## 2014-04-19 NOTE — Progress Notes (Signed)
Medical Consultation PROGRESS NOTE  LONNA RABOLD XVQ:[id number] DOB: July 30, 1925 DOA: 2014-05-03 PCP: Moshe Cipro, MD  HPI: Julia Goodman is a 81 y.o. female who approximately 3 weeks ago injured her right foot on a wheelchair. She developed a blister and wounds on the third and fourth toes of the right foot. She was seen by her podiatrist 05/03/2022. He discussed the case with Dr. Oneida Alar. Dr. Oneida Alar recommended that the patient come to the [facility]. Patient is admitted to vascular surgery, hospitalist consulted for medical management.  Subjective / 24 H Interval events No events overnight, she is feeling well, pain controlled Reports intermittent wheezing this morning   Assessment/Plan: Principal Problem:   Gangrene associated with diabetes mellitus Active Problems:   COPD (chronic obstructive pulmonary disease)   CKD (chronic kidney disease), stage III   Diabetes mellitus   Atrial fibrillation  Right foot gangrene  - per vascular, plan for arteriogram on Monday  - continue Vancomycin and Zosyn   DM  - controlled with most recent A1C of 6.3 - with renal and vascular complications - continue home medications with glipizide and Tradjenta, SSI - will hold oral agents on Monday morning since she is NPO.   COPD  - stable, no wheezing on exam but reported scant wheezing this morning - add xopenex - incentive spirometry  - continue oxygen, she is on chronic O2 - discontinue fluids today   CKD stage III-IV - discontinue Relafen, patient has been on this for years, scheduled three times daily - K 5.0 yesterday, stop K containing fluids, recheck K today  - eating well today, given wheezing will discontinue IVF, will resume gentle hydration Sunday night in preparation for arteriogram on Monday - Cr at baseline when compared to 2012  History of PAF  - currently in sinus rhythm on telemetry - continue sotalol - oral anticoagulation on hold, continue heprin gtt - no 2D  echo in our system  Lung mass / Malignancy  - followed at [place] s/p ablation 11/2013.  - outpatient follow up, supposed to have a repeat CT scan in April    We'll continue to follow, thank you for this consult.      Diet: Diet Carb Modified Diet NPO time specified Except for: Sips with Meds Fluids: KVO DVT Prophylaxis: heparin gtt  Code Status: Full Code Family Communication: d/w daughter bedside  Disposition Plan: remain inpatient  Procedures:  None    Antibiotics Vancomycin May 03, 2022 >> Zosyn May 03, 2022 >>   Studies  Dg Foot Complete Right  May 03, 2014   CLINICAL DATA:  Right foot swelling and drainage from second and fourth toes. Trauma last week. Initial encounter.  EXAM: RIGHT FOOT COMPLETE - 3+ VIEW  COMPARISON:  None.  FINDINGS: The bones are demineralized. No acute fracture, dislocation or bone destruction identified. There are mild degenerative changes at the first metatarsal phalangeal joint. There is generalized forefoot soft tissue swelling without evidence of foreign body or soft tissue emphysema. Scattered vascular and soft tissue calcifications are noted in the distal lower leg.  IMPRESSION: No acute osseous findings or radiographic evidence of osteomyelitis.   Electronically Signed   By: Richardean Sale M.D.   On: May 03, 2014 22:54    Objective  Filed Vitals:   04/18/14 0441 04/18/14 1509 04/18/14 2049 04/19/14 0513  BP: 157/123 121/57 134/70 117/84  Pulse: 64 53 56 58  Temp: 97.8 F (36.6 C) 97.8 F (36.6 C) 97.9 F (36.6 C) 97.4 F (36.3 C)  TempSrc:  Oral  Oral Oral  Resp: 17 18 20 18   Height:      Weight:      SpO2: 90% 96% 97% 95%   No intake or output data in the 24 hours ending 04/19/14 1159 Filed Weights   04/17/14 2128  Weight: 92.987 kg (205 lb)   Exam:  General:  NAD  HEENT: no scleral icterus  Cardiovascular: RRR without MRG  Respiratory: no wheezing, overall decreased breath sounds  Abdomen: soft, non tender  MSK/Extremities: right  foot wound involving right third and fourth toe with gangrene/cellulitis/erythema/associated edema  Skin: no rashes  Neuro: non focal   Data Reviewed: Basic Metabolic Panel:  Recent Labs Lab 04/17/14 2142 04/18/14 0323  NA 138 139  K 4.9 5.0  CL 105 107  CO2 26 22  GLUCOSE 222* 129*  BUN 48* 42*  CREATININE 1.89* 1.64*  CALCIUM 9.5 8.9   Liver Function Tests:  Recent Labs Lab 04/18/14 0323  AST 23  ALT 25  ALKPHOS 52  BILITOT 0.6  PROT 7.1  ALBUMIN 2.8*   CBC:  Recent Labs Lab 04/17/14 2142 04/18/14 0323 04/19/14 0620  WBC 4.3 5.7 4.5  HGB 12.4 12.4 11.0*  HCT 38.2 37.6 34.3*  MCV 95.0 95.4 95.5  PLT 348 328 293   CBG:  Recent Labs Lab 04/18/14 0758 04/18/14 1220 04/18/14 1709 04/18/14 2141  GLUCAP 137* 140* 141* 103*    Recent Results (from the past 240 hour(s))  Blood culture (routine x 2)     Status: None (Preliminary result)   Collection Time: 04/17/14  9:42 PM  Result Value Ref Range Status   Specimen Description BLOOD RIGHT ANTECUBITAL  Final   Special Requests BOTTLES DRAWN AEROBIC AND ANAEROBIC 5CC EACH  Final   Culture   Final           BLOOD CULTURE RECEIVED NO GROWTH TO DATE CULTURE WILL BE HELD FOR 5 DAYS BEFORE ISSUING A FINAL NEGATIVE REPORT Performed at [facility]    Report Status PENDING  Incomplete  Blood culture (routine x 2)     Status: None (Preliminary result)   Collection Time: 04/17/14 11:25 PM  Result Value Ref Range Status   Specimen Description BLOOD LEFT ANTECUBITAL  Final   Special Requests BOTTLES DRAWN AEROBIC AND ANAEROBIC 5CC EACH  Final   Culture   Final           BLOOD CULTURE RECEIVED NO GROWTH TO DATE CULTURE WILL BE HELD FOR 5 DAYS BEFORE ISSUING A FINAL NEGATIVE REPORT Performed at [facility]    Report Status PENDING  Incomplete  Urine culture     Status: None   Collection Time: 04/18/14 12:46 AM  Result Value Ref Range Status   Specimen Description URINE, RANDOM  Final    Special Requests NONE  Final   Colony Count NO GROWTH Performed at [facility]   Final   Culture NO GROWTH Performed at [facility]   Final   Report Status 04/19/2014 FINAL  Final     Scheduled Meds: . amitriptyline  25 mg Oral QHS  . calcium-vitamin D  1 tablet Oral Daily  . donepezil  10 mg Oral QHS  . escitalopram  5 mg Oral BID  . feeding supplement (GLUCERNA SHAKE)  237 mL Oral TID WC  . fenofibrate  160 mg Oral Daily  . fluticasone  1 spray Each Nare Daily  . folic acid  1 mg Oral Daily  . furosemide  20 mg Oral QODAY  . glipiZIDE  10 mg Oral BID AC  . insulin aspart  0-15 Units Subcutaneous TID WC  . linagliptin  5 mg Oral Daily  . loratadine  10 mg Oral Daily  . multivitamin with minerals  0.5 tablet Oral BID  . nitroGLYCERIN  0.4 mg Transdermal Daily  . omega-3 acid ethyl esters  1 g Oral Daily  . pantoprazole  40 mg Oral Daily  . piperacillin-tazobactam (ZOSYN)  IV  3.375 g Intravenous 3 times per day  . pravastatin  80 mg Oral q1800  . sotalol  80 mg Oral BID  . vancomycin  1,000 mg Intravenous Q24H  . Vitamin D (Ergocalciferol)  50,000 Units Oral Q Sat   Continuous Infusions: . 0.9 % NaCl with KCl 20 mEq / L 10 mL/hr at 04/19/14 1159  . heparin 1,400 Units/hr (04/19/14 0820)    Marzetta Board, MD [facility] Pager [phone]. If 7 PM - 7 AM, please contact night-coverage at [email or web address], password [facility] 04/19/2014, 11:59 AM  LOS: 1 day

## 2014-04-19 NOTE — Progress Notes (Signed)
ANTICOAGULATION CONSULT NOTE - Follow up  Pharmacy Consult for heparin Indication: atrial fibrillation  Allergies  Allergen Reactions  . Hydrocodone Anaphylaxis  . Ace Inhibitors Other (See Comments)  . Ciprofloxacin   . Ketamine Other (See Comments)    halluciantion  . Niaspan [Niacin Er] Other (See Comments)    jitters  . Zetia [Ezetimibe]     Patient Measurements: Height: 5\' 6"  (167.6 cm) Weight: 205 lb (92.987 kg) IBW/kg (Calculated) : 59.3 Heparin Dosing Weight: 80 kg  Vital Signs: Temp: 97.4 F (36.3 C) (03/12 0513) Temp Source: Oral (03/12 0513) BP: 117/84 mmHg (03/12 0513) Pulse Rate: 58 (03/12 0513)  Labs:  Recent Labs  04/17/14 2142 04/18/14 0323 04/18/14 1257 04/18/14 2301 04/19/14 0620  HGB 12.4 12.4  --   --  11.0*  HCT 38.2 37.6  --   --  34.3*  PLT 348 328  --   --  293  APTT  --   --  34 42* 54*  LABPROT  --  15.1  --   --   --   INR  --  1.18  --   --   --   HEPARINUNFRC  --   --  0.40 0.27* 0.33  CREATININE 1.89* 1.64*  --   --   --     Estimated Creatinine Clearance: 28.3 mL/min (by C-G formula based on Cr of 1.64).   Medical History: Past Medical History  Diagnosis Date  . Diabetes mellitus   . Leg pain   . COPD (chronic obstructive pulmonary disease)   . Cardiomyopathy   . Irregular heartbeat   . Coronary artery disease   . Myocardial infarction   . Peripheral vascular disease   . Cancer     Skin Melanoma  . CHF (congestive heart failure)   . Arthritis     OSTEOARTHRITIS    Medications:  Prescriptions prior to admission  Medication Sig Dispense Refill Last Dose  . acetaminophen-codeine (TYLENOL #3) 300-30 MG per tablet Take 1 tablet by mouth every 4 (four) hours as needed (pain).    04/17/2014 at Unknown time  . amitriptyline (ELAVIL) 25 MG tablet Take 25 mg by mouth at bedtime.   04/16/2014 at Unknown time  . apixaban (ELIQUIS) 2.5 MG TABS tablet Take 2.5 mg by mouth 2 (two) times daily.   04/17/2014 at am  . Calcium  Carbonate-Vitamin D (OSCAL 500/200 D-3 PO) Take 1 tablet by mouth daily with supper.    04/17/2014 at Unknown time  . donepezil (ARICEPT) 10 MG tablet Take 10 mg by mouth at bedtime.   04/16/2014 at Unknown time  . escitalopram (LEXAPRO) 10 MG tablet Take 5 mg by mouth 2 (two) times daily.   04/17/2014 at Unknown time  . fenofibrate 160 MG tablet Take 160 mg by mouth every morning.    04/17/2014 at Unknown time  . fexofenadine (ALLEGRA) 180 MG tablet Take 180 mg by mouth daily.   04/16/2014 at Unknown time  . fish oil-omega-3 fatty acids 1000 MG capsule Take 1 g by mouth every morning.    04/16/2014 at Unknown time  . fluticasone (FLONASE) 50 MCG/ACT nasal spray Place 1 spray into both nostrils daily as needed for allergies.    unknown  . FOLIC ACID PO Take 1 tablet by mouth every morning.   04/17/2014 at Unknown time  . furosemide (LASIX) 20 MG tablet Take 20 mg by mouth every other day.   04/16/2014 at Unknown time  . Ginkgo Biloba (GINKGO  PO) Take 1 tablet by mouth daily with supper.    04/17/2014 at Unknown time  . glipiZIDE (GLUCOTROL) 5 MG tablet Take 10 mg by mouth 2 (two) times daily before a meal.    04/17/2014 at Unknown time  . GLUCOSAMINE-CHONDROITIN PO Take 1 tablet by mouth daily with supper.   Past Week at Unknown time  . ipratropium-albuterol (DUONEB) 0.5-2.5 (3) MG/3ML SOLN Take 3 mLs by nebulization 2 (two) times daily as needed (shortness of breath).   not recent  . Multiple Vitamin (MULTIVITAMIN WITH MINERALS) TABS tablet Take 0.5 tablets by mouth 2 (two) times daily.   04/17/2014 at Unknown time  . nabumetone (RELAFEN) 500 MG tablet Take 500 mg by mouth 3 (three) times daily.    04/17/2014 at Unknown time  . nitroGLYCERIN (NITRODUR - DOSED IN MG/24 HR) 0.4 mg/hr patch Place 0.4 mg onto the skin every morning.   04/17/2014 at Unknown time  . nitroGLYCERIN (NITROSTAT) 0.4 MG SL tablet Place 0.4 mg under the tongue every 5 (five) minutes as needed for chest pain.   unknown  . pravastatin  (PRAVACHOL) 80 MG tablet Take 80 mg by mouth every evening.    04/16/2014 at Unknown time  . sitaGLIPtin (JANUVIA) 100 MG tablet Take 100 mg by mouth every morning.    04/17/2014 at Unknown time  . sotalol (BETAPACE) 80 MG tablet Take 80 mg by mouth 2 (two) times daily.   04/17/2014 at 1000  . Vitamin D, Ergocalciferol, (DRISDOL) 50000 UNITS CAPS capsule Take 50,000 Units by mouth See admin instructions. 5 times monthly   04/15/2014    Assessment: 30 yof continuing on heparin IV for afib. Takes apixaban chronically for Afib and has been held for possible surgery. Last dose of apixaban was 3/10 in the AM. Will adjust heparin rate based on aPTT until apixaban effect wears off. Hg slight decrease to 11, plt stable wnl. No bleeding documented.  Baseline heparin level = 0.4 and aPTT =34 seconds  Heparin infusing at 1250 units/hr. aPTT = 54 sec and HL = 0.33 Remains subtherapeutic, goal aPTT 66-102 seconds.    Goal of Therapy:  Heparin level 0.3-0.7 units/ml aPTT 66-102 seconds Monitor platelets by anticoagulation protocol: Yes   Plan:  Increase heparin drip to 1400 units/hr  8 hr HL/aPTT Daily HL/aPTT (until aPTT and heparin levels correlate, then HL only) Monitor for s/sx of bleeding  Elicia Lamp, PharmD Clinical Pharmacist - Resident Pager [phone] 04/19/2014 8:17 AM

## 2014-04-20 LAB — CBC
HEMATOCRIT: 36.2 % (ref 36.0–46.0)
Hemoglobin: 11.7 g/dL — ABNORMAL LOW (ref 12.0–15.0)
MCH: 31 pg (ref 26.0–34.0)
MCHC: 32.3 g/dL (ref 30.0–36.0)
MCV: 95.8 fL (ref 78.0–100.0)
Platelets: 359 10*3/uL (ref 150–400)
RBC: 3.78 MIL/uL — AB (ref 3.87–5.11)
RDW: 13.3 % (ref 11.5–15.5)
WBC: 5.5 10*3/uL (ref 4.0–10.5)

## 2014-04-20 LAB — HEPARIN LEVEL (UNFRACTIONATED)
Heparin Unfractionated: 0.31 IU/mL (ref 0.30–0.70)
Heparin Unfractionated: 0.55 IU/mL (ref 0.30–0.70)

## 2014-04-20 LAB — GLUCOSE, CAPILLARY
GLUCOSE-CAPILLARY: 108 mg/dL — AB (ref 70–99)
GLUCOSE-CAPILLARY: 124 mg/dL — AB (ref 70–99)
Glucose-Capillary: 157 mg/dL — ABNORMAL HIGH (ref 70–99)
Glucose-Capillary: 144 mg/dL — ABNORMAL HIGH (ref 70–99)
Glucose-Capillary: 146 mg/dL — ABNORMAL HIGH (ref 70–99)

## 2014-04-20 LAB — BASIC METABOLIC PANEL
Anion gap: 10 (ref 5–15)
BUN: 30 mg/dL — ABNORMAL HIGH (ref 6–23)
CO2: 23 mmol/L (ref 19–32)
CREATININE: 1.25 mg/dL — AB (ref 0.50–1.10)
Calcium: 9.2 mg/dL (ref 8.4–10.5)
Chloride: 103 mmol/L (ref 96–112)
GFR calc non Af Amer: 38 mL/min — ABNORMAL LOW (ref >90)
GFR, EST AFRICAN AMERICAN: 44 mL/min — AB (ref >90)
Glucose, Bld: 125 mg/dL — ABNORMAL HIGH (ref 70–99)
Potassium: 5.3 mmol/L — ABNORMAL HIGH (ref 3.5–5.1)
SODIUM: 136 mmol/L (ref 135–145)

## 2014-04-20 LAB — APTT: aPTT: 56 seconds — ABNORMAL HIGH (ref 24–37)

## 2014-04-20 MED ORDER — SODIUM CHLORIDE 0.45 % IV SOLN
INTRAVENOUS | Status: DC
Start: 2014-04-20 — End: 2014-04-21
  Administered 2014-04-20: 10:00:00 via INTRAVENOUS

## 2014-04-20 MED ORDER — TRAMADOL HCL 50 MG PO TABS
50.0000 mg | ORAL_TABLET | Freq: Four times a day (QID) | ORAL | Status: DC | PRN
  Administered 2014-04-20 – 2014-05-01 (×17): 50 mg via ORAL
  Filled 2014-04-20 (×18): qty 1

## 2014-04-20 NOTE — Progress Notes (Addendum)
ANTICOAGULATION CONSULT NOTE - Follow up  Pharmacy Consult for heparin Indication: atrial fibrillation  Allergies  Allergen Reactions  . Hydrocodone Anaphylaxis  . Ace Inhibitors Other (See Comments)  . Ciprofloxacin   . Ketamine Other (See Comments)    halluciantion  . Niaspan [Niacin Er] Other (See Comments)    jitters  . Zetia [Ezetimibe]     Patient Measurements: Height: 5\' 6"  (167.6 cm) Weight: 205 lb (92.987 kg) IBW/kg (Calculated) : 59.3 Heparin Dosing Weight: 80 kg  Vital Signs: Temp: 97.4 F (36.3 C) (03/13 0547) Temp Source: Oral (03/13 0547) BP: 117/84 mmHg (03/13 0547) Pulse Rate: 56 (03/13 0547)  Labs:  Recent Labs  04/18/14 0323  04/19/14 0620 04/19/14 1250 04/19/14 1640 04/20/14 0553  HGB 12.4  --  11.0*  --   --  11.7*  HCT 37.6  --  34.3*  --   --  36.2  PLT 328  --  293  --   --  359  APTT  --   < > 54*  --  64* 56*  LABPROT 15.1  --   --   --   --   --   INR 1.18  --   --   --   --   --   HEPARINUNFRC  --   < > 0.33  --  0.43 0.31  CREATININE 1.64*  --   --  1.25*  --  1.25*  < > = values in this interval not displayed.  Estimated Creatinine Clearance: 37.1 mL/min (by C-G formula based on Cr of 1.25).   Medical History: Past Medical History  Diagnosis Date  . Diabetes mellitus   . Leg pain   . COPD (chronic obstructive pulmonary disease)   . Cardiomyopathy   . Irregular heartbeat   . Coronary artery disease   . Myocardial infarction   . Peripheral vascular disease   . Cancer     Skin Melanoma  . CHF (congestive heart failure)   . Arthritis     OSTEOARTHRITIS    Assessment: 42 yof continuing on heparin IV for afib. Takes apixaban chronically for Afib and has been held for possible surgery. Last dose of apixaban was 3/10 in the AM. Will adjust heparin rate based on aPTT until apixaban effect wears off. Hg stable 11.7, plt stable wnl. No bleeding documented.  Baseline heparin level = 0.4 and aPTT =34 seconds  Heparin infusing  at 1500 units/hr. aPTT = 56 sec and HL = 0.31  APTT trended down and remains subtherapeutic, goal aPTT 66-102 seconds. Continue to follow with HLs   Goal of Therapy:  Heparin level 0.3-0.7 units/ml aPTT 66-102 seconds Monitor platelets by anticoagulation protocol: Yes   Plan:  Increase heparin drip to 1700 units/hr  8h HL/aPTT Daily HL/aPTT (until aPTT and heparin levels correlate, then HL only) Monitor for s/sx of bleeding  Elicia Lamp, PharmD Clinical Pharmacist - Resident Pager [phone] 04/20/2014 8:13 AM    ADDENDUM Level this evening therapeutic at 0.55units/mL.  No bleeding noted.  Plan: -cont heparin at 1700 units/hr -daily HL and CBC  Sharrod Achille D. Eathen Budreau, PharmD, BCPS Clinical Pharmacist Pager: [phone] 04/20/2014 6:27 PM

## 2014-04-20 NOTE — Progress Notes (Signed)
Medical Consultation PROGRESS NOTE  Julia Goodman IZT:[id number] DOB: [birth year] DOA: 04/17/2014 PCP: Julia Cipro, MD  HPI: Julia Goodman is a 81 y.o. female who approximately 3 weeks ago injured her right foot on a wheelchair. She developed a blister and wounds on the third and fourth toes of the right foot. She was seen by her podiatrist 3/10. He discussed the case with Dr. Oneida Goodman. Dr. Oneida Goodman recommended that the [facility] come to [facility]. Patient is admitted to vascular surgery, hospitalist consulted for medical management.  Subjective / 24 H Interval events - feeling well this morning, denies chest pain/shortness of breath   Assessment/Plan: Principal Problem:   Gangrene associated with diabetes mellitus Active Problems:   COPD (chronic obstructive pulmonary disease)   CKD (chronic kidney disease), stage III   Diabetes mellitus   Atrial fibrillation  Right foot gangrene  - per vascular, plan for arteriogram on Monday  - continue Vancomycin and Zosyn   DM  - controlled with most recent A1C of 6.3 - with renal and vascular complications - continue home medications with glipizide and Tradjenta, SSI - will hold oral agents on Monday morning since she is NPO.   COPD  - stable, no wheezing - continue xopenex - incentive spirometry  - continue oxygen, she is on chronic O2  CKD stage III - discontinued Relafen, patient has been on this for years, scheduled three times daily - eating well today, given wheezing will discontinue IVF, will resume gentle hydration Sunday night in preparation for arteriogram on Monday - Cr at baseline when compared to 2012  History of PAF  - currently in sinus rhythm on telemetry - continue sotalol - oral anticoagulation on hold, continue heprin gtt - no 2D echo in our system  Lung mass / Malignancy  - followed at [facility]p [facility] 11/2013.  - outpatient follow up, supposed to have a repeat CT scan in April    We'll  continue to follow, thank you for this consult.      Diet: Diet Carb Modified Diet NPO time specified Except for: Sips with Meds Fluids: KVO DVT Prophylaxis: heparin gtt  Code Status: Full Code Family Communication: none bedside this morning  Disposition Plan: remain inpatient  Procedures:  None    Antibiotics Vancomycin 3/10 >> Zosyn 3/10 >>   Studies  No results found.  Objective  Filed Vitals:   04/19/14 1700 04/19/14 2100 04/19/14 2324 04/20/14 0547  BP:  128/61  117/84  Pulse:  53  56  Temp:  97.6 F (36.4 C)  97.4 F (36.3 C)  TempSrc:  Oral  Oral  Resp:  18  20  Height:      Weight:      SpO2: 98% 96% 97% 91%    Intake/Output Summary (Last 24 hours) at 04/20/14 0804 Last data filed at 04/20/14 0500  Gross per 24 hour  Intake    520 ml  Output      0 ml  Net    520 ml   Filed Weights   04/17/14 2128  Weight: 92.987 kg (205 lb)   Exam:  General:  NAD  HEENT: no scleral icterus  Cardiovascular: RRR without MRG  Respiratory: no wheezing, overall decreased breath sounds  Abdomen: soft, non tender  MSK/Extremities: right foot wound involving right third and fourth toe with gangrene/cellulitis/erythema/associated edema  Skin: no rashes  Neuro: non focal   Data Reviewed: Basic Metabolic Panel:  Recent Labs Lab 04/17/14 2142 04/18/14 0323  04/19/14 1250  NA 138 139 139  K 4.9 5.0 5.5*  CL 105 107 105  CO2 26 22 28   GLUCOSE 222* 129* 181*  BUN 48* 42* 28*  CREATININE 1.89* 1.64* 1.25*  CALCIUM 9.5 8.9 8.9   Liver Function Tests:  Recent Labs Lab 04/18/14 0323  AST 23  ALT 25  ALKPHOS 52  BILITOT 0.6  PROT 7.1  ALBUMIN 2.8*   CBC:  Recent Labs Lab 04/17/14 2142 04/18/14 0323 04/19/14 0620 04/20/14 0553  WBC 4.3 5.7 4.5 5.5  HGB 12.4 12.4 11.0* 11.7*  HCT 38.2 37.6 34.3* 36.2  MCV 95.0 95.4 95.5 95.8  PLT 348 328 293 359   CBG:  Recent Labs Lab 04/19/14 0623 04/19/14 1150 04/19/14 1721 04/19/14 2144  04/20/14 0611  GLUCAP 101* 208* 108* 131* 124*    Recent Results (from the past 240 hour(s))  Blood culture (routine x 2)     Status: None (Preliminary result)   Collection Time: 04/17/14  9:42 PM  Result Value Ref Range Status   Specimen Description BLOOD RIGHT ANTECUBITAL  Final   Special Requests BOTTLES DRAWN AEROBIC AND ANAEROBIC 5CC EACH  Final   Culture   Final           BLOOD CULTURE RECEIVED NO GROWTH TO DATE CULTURE WILL BE HELD FOR 5 DAYS BEFORE ISSUING A FINAL NEGATIVE REPORT Performed at [facility]    Report Status PENDING  Incomplete  Blood culture (routine x 2)     Status: None (Preliminary result)   Collection Time: 04/17/14 11:25 PM  Result Value Ref Range Status   Specimen Description BLOOD LEFT ANTECUBITAL  Final   Special Requests BOTTLES DRAWN AEROBIC AND ANAEROBIC 5CC EACH  Final   Culture   Final           BLOOD CULTURE RECEIVED NO GROWTH TO DATE CULTURE WILL BE HELD FOR 5 DAYS BEFORE ISSUING A FINAL NEGATIVE REPORT Performed at [facility]    Report Status PENDING  Incomplete  Urine culture     Status: None   Collection Time: 04/18/14 12:46 AM  Result Value Ref Range Status   Specimen Description URINE, RANDOM  Final   Special Requests NONE  Final   Colony Count NO GROWTH Performed at [facility]   Final   Culture NO GROWTH Performed at [facility]   Final   Report Status 04/19/2014 FINAL  Final     Scheduled Meds: . amitriptyline  25 mg Oral QHS  . calcium-vitamin D  1 tablet Oral Daily  . donepezil  10 mg Oral QHS  . escitalopram  5 mg Oral BID  . feeding supplement (GLUCERNA SHAKE)  237 mL Oral TID WC  . fenofibrate  160 mg Oral Daily  . fluticasone  1 spray Each Nare Daily  . folic acid  1 mg Oral Daily  . furosemide  20 mg Oral QODAY  . glipiZIDE  10 mg Oral BID AC  . insulin aspart  0-15 Units Subcutaneous TID WC  . linagliptin  5 mg Oral Daily  . loratadine  10 mg Oral Daily  . multivitamin with  minerals  0.5 tablet Oral BID  . nitroGLYCERIN  0.4 mg Transdermal Daily  . omega-3 acid ethyl esters  1 g Oral Daily  . pantoprazole  40 mg Oral Daily  . piperacillin-tazobactam (ZOSYN)  IV  3.375 g Intravenous 3 times per day  . pravastatin  80 mg Oral q1800  .  sotalol  80 mg Oral BID  . vancomycin  1,000 mg Intravenous Q24H  . Vitamin D (Ergocalciferol)  50,000 Units Oral Q Sat   Continuous Infusions: . sodium chloride    . heparin 1,500 Units/hr (04/20/14 0801)    Marzetta Board, MD [facility] Pager [phone]. If 7 PM - 7 AM, please contact night-coverage at [email or web address], password [facility] 04/20/2014, 8:04 AM  LOS: 2 days

## 2014-04-20 NOTE — Consult Note (Addendum)
[facility]  Subjective  -right foot hurts   Objective 117/84 56 97.4 F (36.3 C) (Oral) 20 91%  Intake/Output Summary (Last 24 hours) at 04/20/14 0743 Last data filed at 04/20/14 0500  Gross per 24 hour  Intake    520 ml  Output      0 ml  Net    520 ml   Moist fibrinous exudate between toes 2 and 3 persistant erythema in forefoot  Assessment/Planning: Aortogram with bilat runoff possible intervention tomorrow.  Procedure discussed with pt.  Pt daughter Shirlean Mylar updated by phone [phone] Hyperglycemia per primary team Renal insufficiency improved some with hydration although GFR still abnormal, will try to minimize contrast Gentle IV hydration starting at midnight Will switch T3 to percocet for better pain control  Ree Alcalde E 04/20/2014 7:43 AM --  Laboratory Lab Results:  Recent Labs  04/18/14 0323 04/19/14 0620  WBC 5.7 4.5  HGB 12.4 11.0*  HCT 37.6 34.3*  PLT 328 293   BMET  Recent Labs  04/18/14 0323 04/19/14 1250  NA 139 139  K 5.0 5.5*  CL 107 105  CO2 22 28  GLUCOSE 129* 181*  BUN 42* 28*  CREATININE 1.64* 1.25*  CALCIUM 8.9 8.9    COAG Lab Results  Component Value Date   INR 1.18 04/18/2014   No results found for: PTT

## 2014-04-21 ENCOUNTER — Encounter (HOSPITAL_COMMUNITY): Payer: Self-pay | Admitting: Vascular Surgery

## 2014-04-21 ENCOUNTER — Ambulatory Visit (HOSPITAL_COMMUNITY): Admission: RE | Admit: 2014-04-21 | Payer: Medicare Other | Source: Ambulatory Visit | Admitting: Vascular Surgery

## 2014-04-21 ENCOUNTER — Encounter (HOSPITAL_COMMUNITY)
Admission: EM | Disposition: A | Discharge status: Skilled Nursing Facility | Payer: Self-pay | Source: Home / Self Care | Attending: Vascular Surgery

## 2014-04-21 DIAGNOSIS — I2510 Atherosclerotic heart disease of native coronary artery without angina pectoris: Secondary | ICD-10-CM

## 2014-04-21 DIAGNOSIS — I70261 Atherosclerosis of native arteries of extremities with gangrene, right leg: Secondary | ICD-10-CM

## 2014-04-21 DIAGNOSIS — I509 Heart failure, unspecified: Secondary | ICD-10-CM

## 2014-04-21 HISTORY — PX: PERIPHERAL VASCULAR CATHETERIZATION: SHX172C

## 2014-04-21 HISTORY — PX: ABDOMINAL AORTAGRAM: SHX5454

## 2014-04-21 LAB — CBC
HEMATOCRIT: 35.5 % — AB (ref 36.0–46.0)
Hemoglobin: 11.5 g/dL — ABNORMAL LOW (ref 12.0–15.0)
MCH: 31 pg (ref 26.0–34.0)
MCHC: 32.4 g/dL (ref 30.0–36.0)
MCV: 95.7 fL (ref 78.0–100.0)
Platelets: 320 10*3/uL (ref 150–400)
RBC: 3.71 MIL/uL — AB (ref 3.87–5.11)
RDW: 13.4 % (ref 11.5–15.5)
WBC: 6.1 10*3/uL (ref 4.0–10.5)

## 2014-04-21 LAB — GLUCOSE, CAPILLARY
GLUCOSE-CAPILLARY: 149 mg/dL — AB (ref 70–99)
GLUCOSE-CAPILLARY: 120 mg/dL — AB (ref 70–99)
GLUCOSE-CAPILLARY: 171 mg/dL — AB (ref 70–99)
Glucose-Capillary: 113 mg/dL — ABNORMAL HIGH (ref 70–99)

## 2014-04-21 LAB — POCT ACTIVATED CLOTTING TIME
ACTIVATED CLOTTING TIME: 159 s
Activated Clotting Time: 183 seconds
Activated Clotting Time: 184 seconds

## 2014-04-21 LAB — BASIC METABOLIC PANEL
ANION GAP: 17 — AB (ref 5–15)
BUN: 32 mg/dL — ABNORMAL HIGH (ref 6–23)
CO2: 16 mmol/L — AB (ref 19–32)
CREATININE: 1.22 mg/dL — AB (ref 0.50–1.10)
Calcium: 9 mg/dL (ref 8.4–10.5)
Chloride: 104 mmol/L (ref 96–112)
GFR, EST AFRICAN AMERICAN: 45 mL/min — AB (ref >90)
GFR, EST NON AFRICAN AMERICAN: 39 mL/min — AB (ref >90)
Glucose, Bld: 151 mg/dL — ABNORMAL HIGH (ref 70–99)
Potassium: 5.7 mmol/L — ABNORMAL HIGH (ref 3.5–5.1)
Sodium: 137 mmol/L (ref 135–145)

## 2014-04-21 LAB — HEPARIN LEVEL (UNFRACTIONATED): HEPARIN UNFRACTIONATED: 0.33 [IU]/mL (ref 0.30–0.70)

## 2014-04-21 SURGERY — ABDOMINAL AORTAGRAM
Anesthesia: LOCAL

## 2014-04-21 MED ORDER — LIDOCAINE HCL (PF) 1 % IJ SOLN
INTRAMUSCULAR | Status: AC
  Filled 2014-04-21: qty 30

## 2014-04-21 MED ORDER — HEPARIN (PORCINE) IN NACL 100-0.45 UNIT/ML-% IJ SOLN
1750.0000 [IU]/h | INTRAMUSCULAR | Status: DC
  Administered 2014-04-21: 1700 [IU]/h via INTRAVENOUS
  Administered 2014-04-23 (×2): 1750 [IU]/h via INTRAVENOUS
  Filled 2014-04-21 (×6): qty 250

## 2014-04-21 MED ORDER — SODIUM CHLORIDE 0.45 % IV SOLN
INTRAVENOUS | Status: DC
  Administered 2014-04-21: 600 mL via INTRAVENOUS

## 2014-04-21 MED ORDER — HEPARIN (PORCINE) IN NACL 2-0.9 UNIT/ML-% IJ SOLN
INTRAMUSCULAR | Status: AC
  Filled 2014-04-21: qty 1000

## 2014-04-21 MED ORDER — HEPARIN SODIUM (PORCINE) 1000 UNIT/ML IJ SOLN
INTRAMUSCULAR | Status: AC
Start: 2014-04-21 — End: 2014-04-21
  Filled 2014-04-21: qty 1

## 2014-04-21 MED ORDER — DOCUSATE SODIUM 100 MG PO CAPS
100.0000 mg | ORAL_CAPSULE | Freq: Every day | ORAL | Status: DC
  Administered 2014-04-22: 100 mg via ORAL
  Filled 2014-04-21 (×3): qty 1

## 2014-04-21 MED ORDER — SODIUM POLYSTYRENE SULFONATE 15 GM/60ML PO SUSP
15.0000 g | Freq: Once | ORAL | Status: DC
  Filled 2014-04-21: qty 60

## 2014-04-21 MED ORDER — HEPARIN SODIUM (PORCINE) 1000 UNIT/ML IJ SOLN
INTRAMUSCULAR | Status: AC
  Filled 2014-04-21: qty 1

## 2014-04-21 NOTE — Interval H&P Note (Signed)
History and Physical Interval Note:  04/21/2014 12:08 PM  Julia Goodman  has presented today for surgery, with the diagnosis of gangrene right foot   The various methods of treatment have been discussed with the patient and family. After consideration of risks, benefits and other options for treatment, the patient has consented to  Procedure(s): ABDOMINAL AORTAGRAM (N/A) with runoff possible intervention as a surgical intervention .  The patient's history has been reviewed, patient examined, no change in status, stable for surgery.  I have reviewed the patient's chart and labs.  Questions were answered to the patient's satisfaction.     Kanyon Seibold E

## 2014-04-21 NOTE — Consult Note (Signed)
CARDIOLOGY CONSULT NOTE   Patient ID: Julia Goodman MRN: [id number] DOB/AGE: Aug 15, 1925   Admit date: 04/17/2014 Date of Consult: 04/21/2014   Primary Physician: Moshe Cipro, MD Primary Cardiologist: Dr. Sabra Heck at [facility]. Profile  81 year old female with past medical history of DM, COPD on 3L home O2, CAD s/p cath x2, PVD s/p L common iliac stent 10/2010, CHF, SVT, CHF, PAF on eliquis and CKD present with R toe pain and gangrene/cellulitis. Pending vascular intervention, cardiology consulted for preop risk stratification  Problem List  Past Medical History  Diagnosis Date  . Diabetes mellitus   . Leg pain   . COPD (chronic obstructive pulmonary disease)   . Cardiomyopathy   . Irregular heartbeat   . Coronary artery disease   . Myocardial infarction   . Peripheral vascular disease   . Cancer     Skin Melanoma  . CHF (congestive heart failure)   . Arthritis     OSTEOARTHRITIS    Past Surgical History  Procedure Laterality Date  . Melanoma excision    . Finger surgery    . Aortogram with stenting  11-05-10    Left CIA stent  . Fracture surgery  Feb. 2013    Spine  . Cholecystectomy      Gall Bladder     Allergies  Allergies  Allergen Reactions  . Hydrocodone Anaphylaxis  . Ace Inhibitors Other (See Comments)  . Ciprofloxacin   . Ketamine Other (See Comments)    halluciantion  . Niaspan [Niacin Er] Other (See Comments)    jitters  . Zetia [Ezetimibe]     HPI   The patient is a pleasant 81 year old female with past medical history of DM, COPD on 3L home O2, CAD s/p cath x2, PVD s/p L common iliac stent 10/2010, CHF, SVT, CHF, PAF on eliquis and CKD. Patient has been followed by Dr. Sabra Heck in [place]. She states she had 2 cardiac catheterizations in the past. Her first cardiac catheterization showed chronically occluded major coronary vessel with distal collaterals. Her most recent cardiac catheterization several years ago showed no significant  changes. She denies any prior stents or CABG. She has not been very active at home and walks around with a rolling walker. She does occasionally have chest discomfort which was relieved with nitroglycerin. The last time she took a nitroglycerin was over 3 weeks ago. She denies any recent fever, chill, cough. She states her shortness of breath has been worsening throughout the years, to the point that now she is on 3 L oxygen 24/7 at home. She has previously been followed by Dr. Oneida Alar and underwent left common iliac stent in 2012.  3 weeks ago, while walking around at home, she kicked a stove and had some blood blisters forming on the toes. She had significant amount of pain however did not seek immediate medical attention. She presented to [facility] on 04/17/2014 after her appointment with her podiatrist who noticed the blister and wounds on the third and fourth right toe has signs of infection and gangrenous changes. The case was discussed with Dr. Oneida Alar and patient was subsequently admitted to vascular surgery. She was treated with antibiotic. She underwent lower extremity angiography on 04/21/2014 which showed 80% stenosis in mid right external iliac artery treated with self-expanding stent. She also had subtotal occlusion of the right profunda femoris origin which require right common femoral endarterectomy to improve flow to her right leg. Cardiology has been consulted for cardiac risk stratification prior  to this.  Inpatient Medications  . amitriptyline  25 mg Oral QHS  . calcium-vitamin D  1 tablet Oral Daily  . donepezil  10 mg Oral QHS  . [MAR Hold] escitalopram  5 mg Oral BID  . [MAR Hold] feeding supplement (GLUCERNA SHAKE)  237 mL Oral TID WC  . fenofibrate  160 mg Oral Daily  . fluticasone  1 spray Each Nare Daily  . folic acid  1 mg Oral Daily  . furosemide  20 mg Oral QODAY  . glipiZIDE  10 mg Oral BID AC  . [MAR Hold] insulin aspart  0-15 Units Subcutaneous TID WC  .  loratadine  10 mg Oral Daily  . multivitamin with minerals  0.5 tablet Oral BID  . nitroGLYCERIN  0.4 mg Transdermal Daily  . omega-3 acid ethyl esters  1 g Oral Daily  . pantoprazole  40 mg Oral Daily  . [MAR Hold] piperacillin-tazobactam (ZOSYN)  IV  3.375 g Intravenous 3 times per day  . pravastatin  80 mg Oral q1800  . sodium polystyrene  15 g Oral Once  . sotalol  80 mg Oral BID  . [MAR Hold] vancomycin  1,000 mg Intravenous Q24H  . Vitamin D (Ergocalciferol)  50,000 Units Oral Q Sat    Family History Family History  Problem Relation Age of Onset  . Diabetes Other   . COPD Mother   . Cancer Father   . Diabetes Sister   . Hyperlipidemia Daughter   . Hypertension Daughter   . Hyperlipidemia Son   . Hypertension Son      Social History History   Social History  . Marital Status: Widowed    Spouse Name: N/A  . Number of Children: N/A  . Years of Education: N/A   Occupational History  . Not on file.   Social History Main Topics  . Smoking status: Former Smoker -- 40 years    Types: Cigarettes    Quit date: 11/04/1999  . Smokeless tobacco: Never Used  . Alcohol Use: No  . Drug Use: No  . Sexual Activity: Not on file   Other Topics Concern  . Not on file   Social History Narrative     Review of Systems  General:  No chills, fever, night sweats or weight changes.  Cardiovascular:  No edema, orthopnea, palpitations, paroxysmal nocturnal dyspnea. +dyspnea on exertion. Occasional chest pain, last time >3 wks ago Dermatological: R toe pain with discoloration Respiratory: No cough, dyspnea Urologic: No hematuria, dysuria Abdominal:   No nausea, vomiting, diarrhea, bright red blood per rectum, melena, or hematemesis Neurologic:  No visual changes, wkns, changes in mental status. All other systems reviewed and are otherwise negative except as noted above.  Physical Exam  Blood pressure 148/60, pulse 56, temperature 97.9 F (36.6 C), temperature source Oral,  resp. rate 18, height 5\' 6"  (1.676 m), weight 205 lb (92.987 kg), SpO2 95 %.  General: Pleasant, NAD Psych: Normal affect. Neuro: Alert and oriented X 3. Moves all extremities spontaneously. HEENT: Normal  Neck: Supple without bruits or JVD. Lungs:  Resp regular and unlabored, CTA. Heart: RRR no s3, s4, or murmurs. Abdomen: Soft, non-tender, non-distended, BS + x 4.  Extremities: No clubbing, cyanosis or edema. DP/PT/Radials 2+ and equal bilaterally.  Labs  No results for input(s): CKTOTAL, CKMB, TROPONINI in the last 72 hours. Lab Results  Component Value Date   WBC 6.1 04/21/2014   HGB 11.5* 04/21/2014   HCT 35.5* 04/21/2014   MCV  95.7 04/21/2014   PLT 320 04/21/2014    Recent Labs Lab 04/18/14 0323  04/21/14 0600  NA 139  < > 137  K 5.0  < > 5.7*  CL 107  < > 104  CO2 22  < > 16*  BUN 42*  < > 32*  CREATININE 1.64*  < > 1.22*  CALCIUM 8.9  < > 9.0  PROT 7.1  --   --   BILITOT 0.6  --   --   ALKPHOS 52  --   --   ALT 25  --   --   AST 23  --   --   GLUCOSE 129*  < > 151*  < > = values in this interval not displayed. No results found for: CHOL, HDL, LDLCALC, TRIG No results found for: DDIMER  Radiology/Studies  Dg Foot Complete Right  May 13, 2014   CLINICAL DATA:  Right foot swelling and drainage from second and fourth toes. Trauma last week. Initial encounter.  EXAM: RIGHT FOOT COMPLETE - 3+ VIEW  COMPARISON:  None.  FINDINGS: The bones are demineralized. No acute fracture, dislocation or bone destruction identified. There are mild degenerative changes at the first metatarsal phalangeal joint. There is generalized forefoot soft tissue swelling without evidence of foreign body or soft tissue emphysema. Scattered vascular and soft tissue calcifications are noted in the distal lower leg.  IMPRESSION: No acute osseous findings or radiographic evidence of osteomyelitis.   Electronically Signed   By: Richardean Sale M.D.   On: 13-May-2014 22:54    ECG  No new  EKG  ASSESSMENT AND PLAN  1. R 3rd and 4th toe gangrene  - s/p LE angiography 04/21/2014 80% stenosis in mid right external iliac artery treated with self-expanding stent, subtotal occlusion of the right profunda femoris origin which require right common femoral endarterectomy  - cardiology consulted for preop risk stratefication  - potentially do lexiscan stress test tomorrow AM  2. CAD s/p cath x 2  - no previous stent per pt  - had chronic occlusion of one of the major coronary vessel on previous cath  - potentially request record from Dr. Sabra Heck office at [facility]  - continue sotalol  3. Chronic HF, unkown if systolic vs diastolic  - on 20mg  every other day Lasix at home  - consider obtain echocardiogram  4. DM 5. COPD on 3L home O2 6. PVD s/p L common iliac stent 10/2010 7. SVT: no recurrence.  8. PAF on eliquis   - CHA2DS2-Vasc score 5 (age, female, DM, HF) 9. CKD  Signed, Woodward Ku 04/21/2014, 4:21 PM Patient seen. History reviewed with patient and her family who are present tonight. Her daughter brought old records which includes a diagram from her 25 heart catheterization in [place] which showed a proximal total occlusion of her right coronary artery with good distal collaterals from the LAD to the distal right coronary artery. The patient has had very infrequent mild angina pectoris. Her last chest pain was several weeks ago and responded to a single nitroglycerin. She has a past history of chronic heart failure and has been on minimal diuretic at home. We will check a baseline chest x-ray and EKG . We will risk stratify with a Lexi scan Myoview and a 2-D echo. I agree with assessment and plan as noted above

## 2014-04-21 NOTE — Progress Notes (Signed)
Site area: Left groin a 7 french arterial sheath was removed   Level 0  Pressure Applied For 20 MINUTES    Minutes Beginning at 1650  Manual:   Yes.    Patient Status During Pull:  stable  Post Pull Groin Site:  Level 0  Post Pull Instructions Given:  Yes.    Post Pull Pulses Present:  Yes.    Dressing Applied:  Yes.     Bedrest Begins: 8616  Comments:  Pt  Remain stable during sheath pull.  Pt denies any discomfort at site.

## 2014-04-21 NOTE — H&P (View-Only) (Signed)
[facility]  Subjective  -right foot hurts   Objective 117/84 56 97.4 F (36.3 C) (Oral) 20 91%  Intake/Output Summary (Last 24 hours) at 04/20/14 0743 Last data filed at 04/20/14 0500  Gross per 24 hour  Intake    520 ml  Output      0 ml  Net    520 ml   Moist fibrinous exudate between toes 2 and 3 persistant erythema in forefoot  Assessment/Planning: Aortogram with bilat runoff possible intervention tomorrow.  Procedure discussed with pt.  Pt daughter Shirlean Mylar updated by phone [phone] Hyperglycemia per primary team Renal insufficiency improved some with hydration although GFR still abnormal, will try to minimize contrast Gentle IV hydration starting at midnight Will switch T3 to percocet for better pain control  Ciin Brazzel E 04/20/2014 7:43 AM --  Laboratory Lab Results:  Recent Labs  04/18/14 0323 04/19/14 0620  WBC 5.7 4.5  HGB 12.4 11.0*  HCT 37.6 34.3*  PLT 328 293   BMET  Recent Labs  04/18/14 0323 04/19/14 1250  NA 139 139  K 5.0 5.5*  CL 107 105  CO2 22 28  GLUCOSE 129* 181*  BUN 42* 28*  CREATININE 1.64* 1.25*  CALCIUM 8.9 8.9    COAG Lab Results  Component Value Date   INR 1.18 04/18/2014   No results found for: PTT

## 2014-04-21 NOTE — Progress Notes (Signed)
ANTICOAGULATION CONSULT NOTE - Follow up  Pharmacy Consult for heparin Indication: atrial fibrillation  Allergies  Allergen Reactions  . Hydrocodone Anaphylaxis  . Ace Inhibitors Other (See Comments)  . Ciprofloxacin   . Ketamine Other (See Comments)    halluciantion  . Niaspan [Niacin Er] Other (See Comments)    jitters  . Zetia [Ezetimibe]     Patient Measurements: Height: 5\' 6"  (167.6 cm) Weight: 205 lb (92.987 kg) IBW/kg (Calculated) : 59.3 Heparin Dosing Weight: 80 kg  Vital Signs: Temp: 98.3 F (36.8 C) (03/14 1745) Temp Source: Oral (03/14 1745) BP: 127/75 mmHg (03/14 1745) Pulse Rate: 56 (03/14 1745)  Labs:  Recent Labs  04/19/14 0620 04/19/14 1250 04/19/14 1640 04/20/14 0553 04/20/14 1620 04/21/14 0600  HGB 11.0*  --   --  11.7*  --  11.5*  HCT 34.3*  --   --  36.2  --  35.5*  PLT 293  --   --  359  --  320  APTT 54*  --  64* 56*  --   --   HEPARINUNFRC 0.33  --  0.43 0.31 0.55 0.33  CREATININE  --  1.25*  --  1.25*  --  1.22*    Estimated Creatinine Clearance: 38 mL/min (by C-G formula based on Cr of 1.22).   Medical History: Past Medical History  Diagnosis Date  . Diabetes mellitus   . Leg pain   . COPD (chronic obstructive pulmonary disease)   . Cardiomyopathy   . Irregular heartbeat   . Coronary artery disease   . Myocardial infarction   . Peripheral vascular disease   . Cancer     Skin Melanoma  . CHF (congestive heart failure)   . Arthritis     OSTEOARTHRITIS    Assessment: 76 yof continuing on heparin IV for afib. Takes apixaban chronically for Afib and has been held for possible surgery. Last dose of apixaban was 3/10 in the AM. Now adjusting heparin only base on anti-Xa level.   Anti-Xa level 0.33 this morning on 1700 units/hr. Heparin stopped at 7AM for abd aortagram.  Hg stable 11.5, plt stable wnl. No bleeding documented.  Orders received post aortogram to resume heparin without bolus. Will restart heparin tonight at 2330  and check HL in am of 3/15.   Goal of Therapy:  Heparin level 0.3-0.7 units/ml aPTT 66-102 seconds Monitor platelets by anticoagulation protocol: Yes   Plan:  Restart heparin infusion 1700 units/hr at 2330 tonight HL 8h after restart Follow up restarting oral anticaogulation  Erin Hearing PharmD., BCPS Clinical Pharmacist Pager [phone] 04/21/2014 5:53 PM

## 2014-04-21 NOTE — Progress Notes (Signed)
Medical Consultation PROGRESS NOTE  Julia Goodman YKD:[id number] DOB: August 15, 1925 DOA: 04/17/2014 PCP: Moshe Cipro, MD  HPI: Julia Goodman is a 81 y.o. female who approximately 3 weeks ago injured her right foot on a wheelchair. She developed a blister and wounds on the third and fourth toes of the right foot. She was seen by her podiatrist 3/10. He discussed the case with Dr. Oneida Alar. Dr. Oneida Alar [facility]. Patient is admitted to vascular surgery, hospitalist consulted for medical management.  Subjective / 24 H Interval events - feeling well this morning, denies chest pain/shortness of breath   Assessment/Plan: Principal Problem:   Gangrene associated with diabetes mellitus Active Problems:   COPD (chronic obstructive pulmonary disease)   CKD (chronic kidney disease), stage III   Diabetes mellitus   Atrial fibrillation  Right foot gangrene  - per vascular, plan for arteriogram today - continue Vancomycin and Zosyn   DM  - controlled with most recent A1C of 6.3 - with renal and vascular complications - continue home medications with glipizide and Tradjenta, SSI - will hold oral agents this morning since she is NPO.   COPD  - stable, no wheezing - continue xopenex - incentive spirometry  - continue oxygen, she is on chronic O2  CKD stage III - discontinued Relafen, patient has been on this for years, scheduled three times daily - Cr at baseline when compared to 2012 - bit more hyperkalemic today, will give kayexalate after surgery when she has a diet  History of PAF  - currently in sinus rhythm on telemetry - continue sotalol - oral anticoagulation on hold, continue heprin gtt - no 2D echo in our system  Lung mass / Malignancy  - followed at [place] s/p ablation 11/2013.  - outpatient follow up, supposed to have a repeat CT scan in April    We'll continue to follow, thank you for this consult.      Diet: Diet  NPO time specified Except for: Sips with Meds Fluids: KVO DVT Prophylaxis: heparin gtt  Code Status: Full Code Family Communication: none bedside this morning  Disposition Plan: remain inpatient  Procedures:  None    Antibiotics Vancomycin 3/10 >> Zosyn 3/10 >>   Studies  No results found.  Objective  Filed Vitals:   04/20/14 1613 04/20/14 1953 04/21/14 0537 04/21/14 0638  BP: 146/62 138/70 145/73   Pulse: 51 49 57   Temp: 97.8 F (36.6 C) 97.4 F (36.3 C) 97.9 F (36.6 C)   TempSrc: Oral Oral Oral   Resp: 18 18 18    Height:      Weight:      SpO2: 99% 97% 96% 95%    Intake/Output Summary (Last 24 hours) at 04/21/14 0710 Last data filed at 04/20/14 2300  Gross per 24 hour  Intake    760 ml  Output      0 ml  Net    760 ml   Filed Weights   04/17/14 2128  Weight: 92.987 kg (205 lb)   Exam:  General:  NAD  HEENT: no scleral icterus  Cardiovascular: RRR without MRG  Respiratory: no wheezing, overall decreased breath sounds  Abdomen: soft, non tender  MSK/Extremities: right foot wound involving right third and fourth toe with gangrene/cellulitis/erythema/associated edema  Skin: no rashes  Neuro: non focal   Data Reviewed: Basic Metabolic Panel:  Recent Labs Lab 04/17/14 2142 04/18/14 0323 04/19/14 1250 04/20/14 0553  NA 138 139 139  136  K 4.9 5.0 5.5* 5.3*  CL 105 107 105 103  CO2 26 22 28 23   GLUCOSE 222* 129* 181* 125*  BUN 48* 42* 28* 30*  CREATININE 1.89* 1.64* 1.25* 1.25*  CALCIUM 9.5 8.9 8.9 9.2   Liver Function Tests:  Recent Labs Lab 04/18/14 0323  AST 23  ALT 25  ALKPHOS 52  BILITOT 0.6  PROT 7.1  ALBUMIN 2.8*   CBC:  Recent Labs Lab 04/17/14 2142 04/18/14 0323 04/19/14 0620 04/20/14 0553  WBC 4.3 5.7 4.5 5.5  HGB 12.4 12.4 11.0* 11.7*  HCT 38.2 37.6 34.3* 36.2  MCV 95.0 95.4 95.5 95.8  PLT 348 328 293 359   CBG:  Recent Labs Lab 04/20/14 0611 04/20/14 1237 04/20/14 1753 04/20/14 2217  04/21/14 0543  GLUCAP 124* 157* 144* 146* 149*    Recent Results (from the past 240 hour(s))  Blood culture (routine x 2)     Status: None (Preliminary result)   Collection Time: 04/17/14  9:42 PM  Result Value Ref Range Status   Specimen Description BLOOD RIGHT ANTECUBITAL  Final   Special Requests BOTTLES DRAWN AEROBIC AND ANAEROBIC 5CC EACH  Final   Culture   Final           BLOOD CULTURE RECEIVED NO GROWTH TO DATE CULTURE WILL BE HELD FOR 5 DAYS BEFORE ISSUING A FINAL NEGATIVE REPORT Performed at [facility]    Report Status PENDING  Incomplete  Blood culture (routine x 2)     Status: None (Preliminary result)   Collection Time: 04/17/14 11:25 PM  Result Value Ref Range Status   Specimen Description BLOOD LEFT ANTECUBITAL  Final   Special Requests BOTTLES DRAWN AEROBIC AND ANAEROBIC 5CC EACH  Final   Culture   Final           BLOOD CULTURE RECEIVED NO GROWTH TO DATE CULTURE WILL BE HELD FOR 5 DAYS BEFORE ISSUING A FINAL NEGATIVE REPORT Performed at [facility]    Report Status PENDING  Incomplete  Urine culture     Status: None   Collection Time: 04/18/14 12:46 AM  Result Value Ref Range Status   Specimen Description URINE, RANDOM  Final   Special Requests NONE  Final   Colony Count NO GROWTH Performed at [facility]   Final   Culture NO GROWTH Performed at [facility]   Final   Report Status 04/19/2014 FINAL  Final     Scheduled Meds: . amitriptyline  25 mg Oral QHS  . calcium-vitamin D  1 tablet Oral Daily  . donepezil  10 mg Oral QHS  . escitalopram  5 mg Oral BID  . feeding supplement (GLUCERNA SHAKE)  237 mL Oral TID WC  . fenofibrate  160 mg Oral Daily  . fluticasone  1 spray Each Nare Daily  . folic acid  1 mg Oral Daily  . furosemide  20 mg Oral QODAY  . glipiZIDE  10 mg Oral BID AC  . insulin aspart  0-15 Units Subcutaneous TID WC  . linagliptin  5 mg Oral Daily  . loratadine  10 mg Oral Daily  . multivitamin with  minerals  0.5 tablet Oral BID  . nitroGLYCERIN  0.4 mg Transdermal Daily  . omega-3 acid ethyl esters  1 g Oral Daily  . pantoprazole  40 mg Oral Daily  . piperacillin-tazobactam (ZOSYN)  IV  3.375 g Intravenous 3 times per day  . pravastatin  80 mg Oral q1800  .  sotalol  80 mg Oral BID  . vancomycin  1,000 mg Intravenous Q24H  . Vitamin D (Ergocalciferol)  50,000 Units Oral Q Sat   Continuous Infusions: . sodium chloride 50 mL/hr at 04/20/14 2300  . heparin 1,700 Units/hr (04/20/14 2306)    Marzetta Board, MD [facility] Pager [phone]. If 7 PM - 7 AM, please contact night-coverage at [email or web address], password [facility] 04/21/2014, 7:10 AM  LOS: 3 days

## 2014-04-21 NOTE — Progress Notes (Signed)
ANTICOAGULATION CONSULT NOTE - Follow up  Pharmacy Consult for heparin Indication: atrial fibrillation  Allergies  Allergen Reactions  . Hydrocodone Anaphylaxis  . Ace Inhibitors Other (See Comments)  . Ciprofloxacin   . Ketamine Other (See Comments)    halluciantion  . Niaspan [Niacin Er] Other (See Comments)    jitters  . Zetia [Ezetimibe]     Patient Measurements: Height: 5\' 6"  (167.6 cm) Weight: 205 lb (92.987 kg) IBW/kg (Calculated) : 59.3 Heparin Dosing Weight: 80 kg  Vital Signs: Temp: 97.9 F (36.6 C) (03/14 0537) Temp Source: Oral (03/14 0537) BP: 126/58 mmHg (03/14 1009) Pulse Rate: 53 (03/14 1009)  Labs:  Recent Labs  04/19/14 0620 04/19/14 1250 04/19/14 1640 04/20/14 0553 04/20/14 1620 04/21/14 0600  HGB 11.0*  --   --  11.7*  --  11.5*  HCT 34.3*  --   --  36.2  --  35.5*  PLT 293  --   --  359  --  320  APTT 54*  --  64* 56*  --   --   HEPARINUNFRC 0.33  --  0.43 0.31 0.55 0.33  CREATININE  --  1.25*  --  1.25*  --  1.22*    Estimated Creatinine Clearance: 38 mL/min (by C-G formula based on Cr of 1.22).   Medical History: Past Medical History  Diagnosis Date  . Diabetes mellitus   . Leg pain   . COPD (chronic obstructive pulmonary disease)   . Cardiomyopathy   . Irregular heartbeat   . Coronary artery disease   . Myocardial infarction   . Peripheral vascular disease   . Cancer     Skin Melanoma  . CHF (congestive heart failure)   . Arthritis     OSTEOARTHRITIS    Assessment: 66 yof continuing on heparin IV for afib. Takes apixaban chronically for Afib and has been held for possible surgery. Last dose of apixaban was 3/10 in the AM. Now adjusting heparin only base on anti-Xa level.   Anti-Xa level 0.33 this morning on 1700 units/hr. Heparin stopped at 7AM for abd aortagram.  Hg stable 11.5, plt stable wnl. No bleeding documented.   Goal of Therapy:  Heparin level 0.3-0.7 units/ml aPTT 66-102 seconds Monitor platelets by  anticoagulation protocol: Yes   Plan:  F/u plans for anticoagulation after procedure.  Maryanna Shape, PharmD, BCPS  Clinical Pharmacist  Pager: [phone]   04/21/2014 11:15 AM

## 2014-04-21 NOTE — Interval H&P Note (Signed)
History and Physical Interval Note:  04/21/2014 12:08 PM  Julia Goodman  has presented today for surgery, with the diagnosis of gangrene right foot   The various methods of treatment have been discussed with the patient and family. After consideration of risks, benefits and other options for treatment, the patient has consented to  Procedure(s): ABDOMINAL AORTAGRAM (N/A) as a surgical intervention .  The patient's history has been reviewed, patient examined, no change in status, stable for surgery.  I have reviewed the patient's chart and labs.  Questions were answered to the patient's satisfaction.     Emslee Lopezmartinez E

## 2014-04-21 NOTE — Plan of Care (Signed)
Problem: Phase III Progression Outcomes Goal: Activity at appropriate level-compared to baseline (UP IN CHAIR FOR HEMODIALYSIS)  Outcome: Progressing Patient given instructions for 6 hour bedrest.

## 2014-04-21 NOTE — Op Note (Addendum)
Procedure: Aortogram with right lower extremity runoff, right external iliac stent (self-expanding 7 x 30 Abbott)  Preoperative diagnosis: Nonhealing wound right foot  Postoperative diagnosis: Same  Anesthesia: Local  Operative findings: #1  80% stenosis mid right external iliac artery stented to 0% residual stenosis with 7 x 30 Abbott self-expanding stent                                  #2 subtotal occlusion right profunda femoris origin                                 #3 patent left common iliac stent                                #4 patent right superficial femoral popliteal with 3 vessel tibial runoff  Operative details: After obtaining informed consent, the patient was taken to the [place] lab. The patient was placed in supine position the Angio table. Both groins were prepped and draped in usual sterile fashion. Local anesthesia was inflated over the left groin. I attempted to use ultrasound to localize the left common femoral artery. However due to the patient's obesity I could not visualize the artery well enough to make this useful. The patient was placed under fluoroscopy and there was calcification of the artery over the femoral head. This was used as a target to cannulate the artery directly under fluoroscopic vision. An 035 versacore wire was threaded up the abdominal aorta under fluoroscopic guidance. Next a 5 French sheath placed over the guidewire and the left common femoral artery. This was thoroughly flushed with heparinized saline. A 5 French pigtail catheter was then placed over the guidewire and advanced into the abdominal aorta. An abdominal aortogram was obtained in an AP projection. The infrarenal abdominal aorta is patent. There is a suggestion of a small infrarenal aneurysm just above the aortic bifurcation. The right common iliac artery is patent. The left common iliac artery is patent and there is a stent within this which is widely patent. Next the pectoral catheter was pulled  down just above the aortic bifurcation and oblique views of the pelvis were performed with magnification. On the left side the left common external/internal iliac arteries are widely patent. On the right side the right common iliac and right internal iliac arteries are widely patent. There is a 80% stenosis of the mid right external iliac artery. Next the 5 French pigtail catheter was removed over guidewire exchange for a Lala Lund crossover catheter. This was used to selectively catheterize the right common iliac artery. The versacore wire was then passed up and over the aortic bifurcation down to the iliac bifurcation. The crossover catheter was exchanged over the wire for a 5 French straight catheter. A right lower extremity arteriogram was obtained through this. This again shows the 80% stenosis of the mid right external iliac artery. There is also a high-grade stenosis with subtotal occlusion of the profunda and the distal common femoral artery. The superficial femoral artery is patent. It is small but patent throughout its course with several areas of mild atherosclerotic change and stenoses of 30-40%. The popliteal artery is patent. There is some narrowing of the below-knee popliteal artery but all 3 tibial vessels are patent primary runoff vessels are the peroneal and  posterior tibial artery. The anterior tibial artery does fill but is slow and the last vessel to reach the foot.  At this point it was decided to intervene on the external iliac artery stenosis. The patient was given 9000 units of heparin. ACT was still less than 200. The patient was given an additional 5000 units of heparin. ACT was then confirmed to be above 200. An 035 angled Glidewire was placed through the 5 French straight catheter. This was then manipulated down into the proximal right superficial femoral artery. The 5 French straight catheter was then advanced over this into the right superficial femoral artery. The Glidewire was  removed and exchanged for an 035 Amplatz wire. The 5 French straight catheter was then removed over the guidewire. The 5 French sheath was also removed over the guidewire. A 7 French Terumo sheath was then advanced over the Amplatz wire up in a over the aortic bifurcation into the proximal right external iliac artery. The dilator the sheath was used to predilate the iliac stenosis. The 5 French straight catheter was placed back over the Amplatz wire and the wire exchanged for an [employer] wire. This was advanced into the proximal superficial femoral artery. A 7 x 30 Abbott self-expanding stent was then brought up on the operative field and advanced to the level of the stenosis using roadmapping techniques. This was then slowly. It was then postdilated with a 7x 20 balloon with 2 overlapping inflations. This was inflated to 9 atm for 1 minute each time.  Completion arteriogram was then performed which showed 0 residual percent stenosis of the right external iliac artery. One final view of the right femoral bifurcation was performed which again shows subtotal occlusion of the origin of the profunda with a patent common femoral artery and patent origin of the superficial femoral artery. At this point a 4 [place] straight catheter was passed over the guidewire to straighten the Rosen wire and pulled back through the stent. The sheath was then pulled back into the left in the pelvis below the internal iliac artery. As was thoroughly flushed with heparinized saline. The guidewire was removed.  The patient tolerated the procedure well and there were no complications. The patient was taken to the holding area of the sheath in place to be pulled after the ACT is 175.  Operative management: At this point the patient's primary area of peripheral arterial disease is at her right femoral bifurcation. She will need a right common femoral endarterectomy to improve flow to her right leg. She will need cardiac risk stratification  prior to this. It has been quite some time before she has had a cardiac stress test. If her cardiac stress test is reasonable then she will need a right femoral endarterectomy and profundoplasty.  I have contacted [facility] for cardiac status eval  Ruta Hinds, MD [facility] Office: [phone] Pager: [phone]

## 2014-04-22 ENCOUNTER — Inpatient Hospital Stay (HOSPITAL_COMMUNITY): Payer: Medicare Other

## 2014-04-22 ENCOUNTER — Other Ambulatory Visit: Payer: Self-pay

## 2014-04-22 ENCOUNTER — Other Ambulatory Visit: Payer: Self-pay | Admitting: *Deleted

## 2014-04-22 DIAGNOSIS — I5042 Chronic combined systolic (congestive) and diastolic (congestive) heart failure: Secondary | ICD-10-CM

## 2014-04-22 DIAGNOSIS — Z01810 Encounter for preprocedural cardiovascular examination: Secondary | ICD-10-CM

## 2014-04-22 DIAGNOSIS — N183 Chronic kidney disease, stage 3 (moderate): Secondary | ICD-10-CM

## 2014-04-22 DIAGNOSIS — I739 Peripheral vascular disease, unspecified: Secondary | ICD-10-CM

## 2014-04-22 DIAGNOSIS — E0822 Diabetes mellitus due to underlying condition with diabetic chronic kidney disease: Secondary | ICD-10-CM

## 2014-04-22 DIAGNOSIS — I2510 Atherosclerotic heart disease of native coronary artery without angina pectoris: Secondary | ICD-10-CM

## 2014-04-22 DIAGNOSIS — R079 Chest pain, unspecified: Secondary | ICD-10-CM

## 2014-04-22 LAB — BASIC METABOLIC PANEL
Anion gap: 8 (ref 5–15)
BUN: 23 mg/dL (ref 6–23)
CO2: 26 mmol/L (ref 19–32)
CREATININE: 1.09 mg/dL (ref 0.50–1.10)
Calcium: 9.3 mg/dL (ref 8.4–10.5)
Chloride: 105 mmol/L (ref 96–112)
GFR calc Af Amer: 52 mL/min — ABNORMAL LOW (ref >90)
GFR, EST NON AFRICAN AMERICAN: 45 mL/min — AB (ref >90)
Glucose, Bld: 121 mg/dL — ABNORMAL HIGH (ref 70–99)
Potassium: 5.1 mmol/L (ref 3.5–5.1)
Sodium: 139 mmol/L (ref 135–145)

## 2014-04-22 LAB — CBC
HCT: 36.5 % (ref 36.0–46.0)
HEMOGLOBIN: 11.7 g/dL — AB (ref 12.0–15.0)
MCH: 30.5 pg (ref 26.0–34.0)
MCHC: 32.1 g/dL (ref 30.0–36.0)
MCV: 95.3 fL (ref 78.0–100.0)
Platelets: 404 10*3/uL — ABNORMAL HIGH (ref 150–400)
RBC: 3.83 MIL/uL — ABNORMAL LOW (ref 3.87–5.11)
RDW: 13.3 % (ref 11.5–15.5)
WBC: 7.5 10*3/uL (ref 4.0–10.5)

## 2014-04-22 LAB — GLUCOSE, CAPILLARY
GLUCOSE-CAPILLARY: 128 mg/dL — AB (ref 70–99)
GLUCOSE-CAPILLARY: 147 mg/dL — AB (ref 70–99)
Glucose-Capillary: 156 mg/dL — ABNORMAL HIGH (ref 70–99)
Glucose-Capillary: 149 mg/dL — ABNORMAL HIGH (ref 70–99)

## 2014-04-22 LAB — POCT ACTIVATED CLOTTING TIME
ACTIVATED CLOTTING TIME: 183 s
Activated Clotting Time: 245 seconds

## 2014-04-22 LAB — HEPARIN LEVEL (UNFRACTIONATED): HEPARIN UNFRACTIONATED: 0.31 [IU]/mL (ref 0.30–0.70)

## 2014-04-22 LAB — VANCOMYCIN, TROUGH: Vancomycin Tr: 17.6 ug/mL (ref 10.0–20.0)

## 2014-04-22 LAB — BRAIN NATRIURETIC PEPTIDE: B Natriuretic Peptide: 391.9 pg/mL — ABNORMAL HIGH (ref 0.0–100.0)

## 2014-04-22 MED ORDER — VANCOMYCIN HCL IN DEXTROSE 1-5 GM/200ML-% IV SOLN
1000.0000 mg | INTRAVENOUS | Status: DC
  Administered 2014-04-23 – 2014-04-27 (×4): 1000 mg via INTRAVENOUS
  Filled 2014-04-22 (×6): qty 200

## 2014-04-22 MED ORDER — FUROSEMIDE 10 MG/ML IJ SOLN
20.0000 mg | Freq: Once | INTRAMUSCULAR | Status: AC
  Administered 2014-04-22: 20 mg via INTRAVENOUS
  Filled 2014-04-22: qty 2

## 2014-04-22 MED ORDER — REGADENOSON 0.4 MG/5ML IV SOLN
0.4000 mg | Freq: Once | INTRAVENOUS | Status: AC
  Administered 2014-04-22: 0.4 mg via INTRAVENOUS
  Filled 2014-04-22: qty 5

## 2014-04-22 MED ORDER — REGADENOSON 0.4 MG/5ML IV SOLN
INTRAVENOUS | Status: AC
  Administered 2014-04-22: 0.4 mg via INTRAVENOUS
  Filled 2014-04-22: qty 5

## 2014-04-22 MED ORDER — TECHNETIUM TC 99M SESTAMIBI GENERIC - CARDIOLITE
10.0000 | Freq: Once | INTRAVENOUS | Status: AC | PRN
Start: 2014-04-22 — End: 2014-04-22
  Administered 2014-04-22: 10 via INTRAVENOUS

## 2014-04-22 MED ORDER — PIPERACILLIN-TAZOBACTAM 3.375 G IVPB
3.3750 g | Freq: Three times a day (TID) | INTRAVENOUS | Status: DC
  Administered 2014-04-22 – 2014-04-27 (×14): 3.375 g via INTRAVENOUS
  Filled 2014-04-22 (×15): qty 50

## 2014-04-22 MED ORDER — LINAGLIPTIN 5 MG PO TABS
5.0000 mg | ORAL_TABLET | Freq: Every day | ORAL | Status: DC
  Administered 2014-04-23 – 2014-05-01 (×9): 5 mg via ORAL
  Filled 2014-04-22 (×9): qty 1

## 2014-04-22 MED ORDER — TECHNETIUM TC 99M SESTAMIBI GENERIC - CARDIOLITE
30.0000 | Freq: Once | INTRAVENOUS | Status: AC | PRN
  Administered 2014-04-22: 30 via INTRAVENOUS

## 2014-04-22 NOTE — Progress Notes (Addendum)
       Patient Name: Julia Goodman Date of Encounter: 04/22/2014    SUBJECTIVE:No CV complaints this AM but seems a little dyspneic in conversation.  TELEMETRY:  Sinus bradycardia: Filed Vitals:   04/21/14 1745 04/21/14 2117 04/22/14 0116 04/22/14 0430  BP: 127/75 156/60  119/83  Pulse: 56 73 69 66  Temp: 98.3 F (36.8 C) 97.5 F (36.4 C)  98.1 F (36.7 C)  TempSrc: Oral Oral  Oral  Resp: 18 18 18 22   Height:      Weight:      SpO2: 100% 92% 90% 94%   No intake or output data in the 24 hours ending 04/22/14 0902 LABS: Basic Metabolic Panel:  Recent Labs  04/21/14 0600 04/22/14 0454  NA 137 139  K 5.7* 5.1  CL 104 105  CO2 16* 26  GLUCOSE 151* 121*  BUN 32* 23  CREATININE 1.22* 1.09  CALCIUM 9.0 9.3   CBC:  Recent Labs  04/21/14 0600 04/22/14 0454  WBC 6.1 7.5  HGB 11.5* 11.7*  HCT 35.5* 36.5  MCV 95.7 95.3  PLT 320 404*     Radiology/Studies:  CXR with CHF  Physical Exam: Blood pressure 119/83, pulse 66, temperature 98.1 F (36.7 C), temperature source Oral, resp. rate 22, height 5\' 6"  (1.676 m), weight 205 lb (92.987 kg), SpO2 94 %. Weight change:   Wt Readings from Last 3 Encounters:  04/17/14 205 lb (92.987 kg)  07/11/13 212 lb (96.163 kg)  05/30/13 212 lb (96.163 kg)    Moderate neck vein elevation Chest with decreased bibasilar breath sounds No edema.  ASSESSMENT:  1. CAD with known total occlusion or RCA by cath 2009 and minimal LAD and CFX disease 2. Chronic diastolic/systolic heart , with mild volume overlaod 3. Gangrenous right foot/toes, will require surgery 4. History or Natasha Mead with PAF inducing CHF. Prior cardioversion needed.  Plan:  The patient will be at high risk for CV complications with general anesthesia. Whether there is prohibitive risk will be determined by planned testing.  The nuclear study wuill be at least moderate risk, but as long as no anterior ischemia, we should ne okay.  Probably needs diuresis. IV  lasix today after CV testing or before if complains of dyspnea.  Demetrios Isaacs 04/22/2014, 9:02 AM

## 2014-04-22 NOTE — Progress Notes (Signed)
   [facility]  Subjective  - Doing well over all.  No new complaints.   Objective 119/83 66 98.1 F (36.7 C) (Oral) 22 94% No intake or output data in the 24 hours ending 04/22/14 0738  left groin soft without hematoma Right foot dressing in place Heart RRR  Lungs non labored breathing  Assessment/Planning: POD #1 Procedure: Aortogram with right lower extremity runoff, right external iliac stent (self-expanding 7 x 30 Abbott Pending cardiology work up and optimization plan for right femoral endarterectomy and profundoplasty.    Laurence Slate [facility] 04/22/2014 7:38 AM --  Laboratory Lab Results:  Recent Labs  04/21/14 0600 04/22/14 0454  WBC 6.1 7.5  HGB 11.5* 11.7*  HCT 35.5* 36.5  PLT 320 404*   BMET  Recent Labs  04/21/14 0600 04/22/14 0454  NA 137 139  K 5.7* 5.1  CL 104 105  CO2 16* 26  GLUCOSE 151* 121*  BUN 32* 23  CREATININE 1.22* 1.09  CALCIUM 9.0 9.3    COAG Lab Results  Component Value Date   INR 1.18 04/18/2014   No results found for: PTT

## 2014-04-22 NOTE — Progress Notes (Signed)
Echocardiogram 2D Echocardiogram has been performed.  Julia Goodman 04/22/2014, 3:31 PM

## 2014-04-22 NOTE — Progress Notes (Signed)
ANTICOAGULATION AND ANTIBIOTIC CONSULT NOTE - Follow up  Pharmacy Consult for heparin; Vancomycin and Zosyn  Indication: atrial fibrillation; right foot gangrene   Allergies  Allergen Reactions  . Hydrocodone Anaphylaxis  . Ace Inhibitors Other (See Comments)  . Ciprofloxacin   . Ketamine Other (See Comments)    halluciantion  . Niaspan [Niacin Er] Other (See Comments)    jitters  . Zetia [Ezetimibe]     Patient Measurements: Height: 5\' 6"  (167.6 cm) Weight: 205 lb (92.987 kg) IBW/kg (Calculated) : 59.3 Heparin Dosing Weight: 80 kg  Vital Signs: Temp: 98.1 F (36.7 C) (03/15 0430) Temp Source: Oral (03/15 0430) BP: 119/83 mmHg (03/15 0430) Pulse Rate: 66 (03/15 0430)  Labs:  Recent Labs  04/19/14 1640  04/20/14 0553 04/20/14 1620 04/21/14 0600 04/22/14 0454 04/22/14 0730  HGB  --   < > 11.7*  --  11.5* 11.7*  --   HCT  --   --  36.2  --  35.5* 36.5  --   PLT  --   --  359  --  320 404*  --   APTT 64*  --  56*  --   --   --   --   HEPARINUNFRC 0.43  --  0.31 0.55 0.33  --  0.31  CREATININE  --   --  1.25*  --  1.22* 1.09  --   < > = values in this interval not displayed.  Estimated Creatinine Clearance: 42.6 mL/min (by C-G formula based on Cr of 1.09).   Medical History: Past Medical History  Diagnosis Date  . Diabetes mellitus   . Leg pain   . COPD (chronic obstructive pulmonary disease)   . Cardiomyopathy   . Irregular heartbeat   . Coronary artery disease   . Myocardial infarction   . Peripheral vascular disease   . Cancer     Skin Melanoma  . CHF (congestive heart failure)   . Arthritis     OSTEOARTHRITIS    Assessment: 27 yof continuing on heparin IV for afib PLUS Vanc and Zosyn for right foot gangrene.   AC: Takes apixaban chronically for Afib and has been held for possible surgery. Currently on IV heparin at 1700 units/hr. Anti-Xa level 0.31 this morning. Hgb stable, Plt high. No bleeding reported   ID: 3rd/4th right toe  gangrene/cellulitis. WBC wnl. Afebrile. Will require surgery tomorrow. Will collect trough today to guide future dosing.    Goal of Therapy:  Heparin level 0.3-0.7 units/ml Vancomycin trough 10-15 mcg/mL  Monitor platelets by anticoagulation protocol: Yes   Plan:  Increase heparin infusion slightly to 1750 units/hr Vancomycin 1 gm IV Q 24 hours Zosyn 3.375 gm IV Q 8 hours Monitor daily HL, CBC and s/s of bleeding VT at 2230 Follow up restarting oral anticaogulation  Albertina Parr, PharmD., BCPS Clinical Pharmacist Pager [phone]

## 2014-04-22 NOTE — Progress Notes (Signed)
Medical Consultation PROGRESS NOTE  Julia Goodman FXT:[id number] DOB: May 01, 1925 DOA: 04/17/2014 PCP: Moshe Cipro, MD  HPI: Julia Goodman is a 81 y.o. female who approximately 3 weeks ago injured her right foot on a wheelchair. She developed a blister and wounds on the third and fourth toes of the right foot. She was seen by her podiatrist 3/10. He discussed the case with Dr. Oneida Alar. Dr. Oneida Alar recommended that the [facility]. Patient is admitted to vascular surgery, hospitalist consulted for medical management.  Subjective / 24 H Interval events - no complaints, doing well this morning - mild SOB this morning  Assessment/Plan: Principal Problem:   Gangrene associated with diabetes mellitus Active Problems:   COPD (chronic obstructive pulmonary disease)   CKD (chronic kidney disease), stage III   Diabetes mellitus   Atrial fibrillation   CAD in native artery   Chronic combined systolic and diastolic HF (heart failure)   DM  - controlled with most recent A1C of 6.3 - with renal and vascular complications - continue home medications with glipizide and Tradjenta, SSI COPD  - stable - she has mild wheezing likely in the setting of mild fluid overload - Lasix per cardiology, 2D echo pending - continue xopenex - incentive spirometry  - continue oxygen, she is on chronic O2 CKD stage III - discontinued Relafen, patient has been on this for years, scheduled three times daily - Cr at baseline when compared to 2012 Right foot gangrene  - per vascular History of PAF  - currently in sinus rhythm on telemetry - continue sotalol - oral anticoagulation on hold, continue heprin gtt - cardiology following Lung mass / Malignancy  - followed at [facility] s/p ablation 11/2013.  - outpatient follow up, supposed to have a repeat CT scan in April    We'll continue to follow, thank you for this consult.      Diet: Diet NPO time specified Except for: Sips  with Meds Fluids: KVO DVT Prophylaxis: heparin gtt  Code Status: Full Code Family Communication: none bedside this morning  Disposition Plan: remain inpatient  Procedures:  None    Antibiotics Vancomycin 3/10 >> Zosyn 3/10 >>   Studies  Dg Chest 2 View  04/22/2014   CLINICAL DATA:  Dyspnea  EXAM: CHEST  2 VIEW  COMPARISON:  None.  FINDINGS: There is marked cardiomegaly. Vascular and interstitial congestive changes are present. Mild basilar airspace opacities are present. The findings likely represent congestive heart failure. There probably are small effusions collected in the posterior costophrenic angles. No large effusion is evident.  IMPRESSION: Cardiomegaly.  Congestive heart failure.   Electronically Signed   By: Andreas Newport M.D.   On: 04/22/2014 04:38    Objective  Filed Vitals:   04/22/14 1133 04/22/14 1135 04/22/14 1137 04/22/14 1139  BP: 124/60 140/66 136/57 140/59  Pulse: 68 74 72 71  Temp:      TempSrc:      Resp:      Height:      Weight:      SpO2: 95% 94% 93% 94%    Intake/Output Summary (Last 24 hours) at 04/22/14 1213 Last data filed at 04/22/14 1014  Gross per 24 hour  Intake      0 ml  Output    400 ml  Net   -400 ml   Filed Weights   04/17/14 2128  Weight: 92.987 kg (205 lb)   Exam:  General:  NAD  HEENT: no  scleral icterus  Cardiovascular: RRR without MRG  Respiratory: no wheezing, overall decreased breath sounds  Abdomen: soft, non tender  MSK/Extremities: right foot wound involving right third and fourth toe with gangrene/cellulitis/erythema/associated edema  Skin: no rashes  Neuro: non focal   Data Reviewed: Basic Metabolic Panel:  Recent Labs Lab 04/18/14 0323 04/19/14 1250 04/20/14 0553 04/21/14 0600 04/22/14 0454  NA 139 139 136 137 139  K 5.0 5.5* 5.3* 5.7* 5.1  CL 107 105 103 104 105  CO2 22 28 23  16* 26  GLUCOSE 129* 181* 125* 151* 121*  BUN 42* 28* 30* 32* 23  CREATININE 1.64* 1.25* 1.25* 1.22*  1.09  CALCIUM 8.9 8.9 9.2 9.0 9.3   Liver Function Tests:  Recent Labs Lab 04/18/14 0323  AST 23  ALT 25  ALKPHOS 52  BILITOT 0.6  PROT 7.1  ALBUMIN 2.8*   CBC:  Recent Labs Lab 04/18/14 0323 04/19/14 0620 04/20/14 0553 04/21/14 0600 04/22/14 0454  WBC 5.7 4.5 5.5 6.1 7.5  HGB 12.4 11.0* 11.7* 11.5* 11.7*  HCT 37.6 34.3* 36.2 35.5* 36.5  MCV 95.4 95.5 95.8 95.7 95.3  PLT 328 293 359 320 404*   CBG:  Recent Labs Lab 04/21/14 0543 04/21/14 1014 04/21/14 1459 04/21/14 2156 04/22/14 0610  GLUCAP 149* 113* 120* 171* 128*    Recent Results (from the past 240 hour(s))  Blood culture (routine x 2)     Status: None (Preliminary result)   Collection Time: 04/17/14  9:42 PM  Result Value Ref Range Status   Specimen Description BLOOD RIGHT ANTECUBITAL  Final   Special Requests BOTTLES DRAWN AEROBIC AND ANAEROBIC 5CC EACH  Final   Culture   Final           BLOOD CULTURE RECEIVED NO GROWTH TO DATE CULTURE WILL BE HELD FOR 5 DAYS BEFORE ISSUING A FINAL NEGATIVE REPORT Performed at [facility]    Report Status PENDING  Incomplete  Blood culture (routine x 2)     Status: None (Preliminary result)   Collection Time: 04/17/14 11:25 PM  Result Value Ref Range Status   Specimen Description BLOOD LEFT ANTECUBITAL  Final   Special Requests BOTTLES DRAWN AEROBIC AND ANAEROBIC 5CC EACH  Final   Culture   Final           BLOOD CULTURE RECEIVED NO GROWTH TO DATE CULTURE WILL BE HELD FOR 5 DAYS BEFORE ISSUING A FINAL NEGATIVE REPORT Performed at [facility]    Report Status PENDING  Incomplete  Urine culture     Status: None   Collection Time: 04/18/14 12:46 AM  Result Value Ref Range Status   Specimen Description URINE, RANDOM  Final   Special Requests NONE  Final   Colony Count NO GROWTH Performed at [facility]   Final   Culture NO GROWTH Performed at [facility]   Final   Report Status 04/19/2014 FINAL  Final     Scheduled  Meds: . docusate sodium  100 mg Oral Daily  . escitalopram  5 mg Oral BID  . feeding supplement (GLUCERNA SHAKE)  237 mL Oral TID WC  . furosemide  20 mg Intravenous Once  . insulin aspart  0-15 Units Subcutaneous TID WC  . piperacillin-tazobactam (ZOSYN)  IV  3.375 g Intravenous Q8H  . sodium polystyrene  15 g Oral Once  . vancomycin  1,000 mg Intravenous Q24H   Continuous Infusions: . sodium chloride 600 mL (04/21/14 1657)  . heparin 1,700  Units/hr (04/21/14 2313)    Marzetta Board, MD [facility] Pager [phone]. If 7 PM - 7 AM, please contact night-coverage at [email or web address], password [facility] 04/22/2014, 12:13 PM  LOS: 4 days

## 2014-04-22 NOTE — Progress Notes (Signed)
ANTIBIOTIC CONSULT NOTE - FOLLOW UP  Pharmacy Consult for Vancomycin  Indication: Right foot gangrene  Labs:  Recent Labs  04/20/14 0553 04/21/14 0600 04/22/14 0454  WBC 5.5 6.1 7.5  HGB 11.7* 11.5* 11.7*  PLT 359 320 404*  CREATININE 1.25* 1.22* 1.09   Estimated Creatinine Clearance: 42.6 mL/min (by C-G formula based on Cr of 1.09).  Recent Labs  04/22/14 2214  VANCOTROUGH 17.6    Assessment: Therapeutic vancomycin trough, drawn correctly.  Goal of Therapy:  Vancomycin trough level 15-20 mcg/ml  Plan:  -Continue vancomycin 1000 mg IV q24h -Re-check VT as indicated   Narda Bonds 04/22/2014,11:38 PM

## 2014-04-22 NOTE — Progress Notes (Signed)
Utilization review completed.  

## 2014-04-22 NOTE — Consult Note (Addendum)
Pt currently in the middle of ECHO exam.  Stress test with no reversible defect.  Large prior LAD infarct.  Filed Vitals:   04/22/14 1135 04/22/14 1137 04/22/14 1139 04/22/14 1333  BP: 140/66 136/57 140/59   Pulse: 74 72 71 68  Temp:    98.1 F (36.7 C)  TempSrc:    Oral  Resp:    17  Height:      Weight:      SpO2: 94% 93% 94% 97%   CBC    Component Value Date/Time   WBC 7.5 04/22/2014 0454   RBC 3.83* 04/22/2014 0454   HGB 11.7* 04/22/2014 0454   HCT 36.5 04/22/2014 0454   PLT 404* 04/22/2014 0454   MCV 95.3 04/22/2014 0454   MCH 30.5 04/22/2014 0454   MCHC 32.1 04/22/2014 0454   RDW 13.3 04/22/2014 0454     BMET    Component Value Date/Time   NA 139 04/22/2014 0454   K 5.1 04/22/2014 0454   CL 105 04/22/2014 0454   CO2 26 04/22/2014 0454   GLUCOSE 121* 04/22/2014 0454   BUN 23 04/22/2014 0454   CREATININE 1.09 04/22/2014 0454   CALCIUM 9.3 04/22/2014 0454   GFRNONAA 45* 04/22/2014 0454   GFRAA 52* 04/22/2014 0454     Plan is for right femoral endarterectomy on Thursday Plan procedure details and risks discussed with pt and daughter including but not limited to bleeding infection myocardial events, vent dependence short or long term, possible limb loss  Ruta Hinds, MD [facility] [place]: [phone] Pager: [phone]

## 2014-04-22 NOTE — Progress Notes (Signed)
The patient tolerated the lexiscan well.  Tarri Fuller PAC

## 2014-04-23 LAB — GLUCOSE, CAPILLARY
GLUCOSE-CAPILLARY: 127 mg/dL — AB (ref 70–99)
GLUCOSE-CAPILLARY: 154 mg/dL — AB (ref 70–99)
Glucose-Capillary: 137 mg/dL — ABNORMAL HIGH (ref 70–99)
Glucose-Capillary: 105 mg/dL — ABNORMAL HIGH (ref 70–99)

## 2014-04-23 LAB — CBC
HCT: 34.4 % — ABNORMAL LOW (ref 36.0–46.0)
Hemoglobin: 11.3 g/dL — ABNORMAL LOW (ref 12.0–15.0)
MCH: 31.1 pg (ref 26.0–34.0)
MCHC: 32.8 g/dL (ref 30.0–36.0)
MCV: 94.8 fL (ref 78.0–100.0)
PLATELETS: 382 10*3/uL (ref 150–400)
RBC: 3.63 MIL/uL — ABNORMAL LOW (ref 3.87–5.11)
RDW: 13.5 % (ref 11.5–15.5)
WBC: 6.7 10*3/uL (ref 4.0–10.5)

## 2014-04-23 LAB — BASIC METABOLIC PANEL
Anion gap: 10 (ref 5–15)
BUN: 19 mg/dL (ref 6–23)
CHLORIDE: 102 mmol/L (ref 96–112)
CO2: 24 mmol/L (ref 19–32)
Calcium: 9.5 mg/dL (ref 8.4–10.5)
Creatinine, Ser: 1.07 mg/dL (ref 0.50–1.10)
GFR calc Af Amer: 53 mL/min — ABNORMAL LOW (ref >90)
GFR calc non Af Amer: 46 mL/min — ABNORMAL LOW (ref >90)
Glucose, Bld: 131 mg/dL — ABNORMAL HIGH (ref 70–99)
POTASSIUM: 4.6 mmol/L (ref 3.5–5.1)
SODIUM: 136 mmol/L (ref 135–145)

## 2014-04-23 LAB — HEPARIN LEVEL (UNFRACTIONATED): HEPARIN UNFRACTIONATED: 0.38 [IU]/mL (ref 0.30–0.70)

## 2014-04-23 MED ORDER — DEXTROSE 5 % IV SOLN
1.5000 g | INTRAVENOUS | Status: AC
  Administered 2014-04-24: 1.5 g via INTRAVENOUS
  Filled 2014-04-23: qty 1.5

## 2014-04-23 NOTE — Progress Notes (Signed)
       Patient Name: Julia Goodman Date of Encounter: 04/23/2014    SUBJECTIVE:No CV complaints this AM but seems a little dyspneic in conversation. Nasal congestion. Has orthopnea.  TELEMETRY:  Sinus bradycardia Filed Vitals:   04/22/14 1139 04/22/14 1333 04/22/14 2027 04/23/14 0335  BP: 140/59  141/57 140/66  Pulse: 71 68 79 75  Temp:  98.1 F (36.7 C) 98.1 F (36.7 C) 98.7 F (37.1 C)  TempSrc:  Oral Oral Oral  Resp:  17 18 18   Height:      Weight:      SpO2: 94% 97% 100% 98%    Intake/Output Summary (Last 24 hours) at 04/23/14 0752 Last data filed at 04/23/14 0700  Gross per 24 hour  Intake  887.5 ml  Output    403 ml  Net  484.5 ml   LABS: Basic Metabolic Panel:  Recent Labs  04/22/14 0454 04/23/14 0449  NA 139 136  K 5.1 4.6  CL 105 102  CO2 26 24  GLUCOSE 121* 131*  BUN 23 19  CREATININE 1.09 1.07  CALCIUM 9.3 9.5   CBC:  Recent Labs  04/22/14 0454 04/23/14 0449  WBC 7.5 6.7  HGB 11.7* 11.3*  HCT 36.5 34.4*  MCV 95.3 94.8  PLT 404* 382   BNP    Component Value Date/Time   BNP 391.9* 04/22/2014 1550    Radiology/Studies:   Nuclear 04/22/14: No ischemia and EF normal  CXR with CHF on admission  Physical Exam: Blood pressure 140/66, pulse 75, temperature 98.7 F (37.1 C), temperature source Oral, resp. rate 18, height 5\' 6"  (1.676 m), weight 205 lb (92.987 kg), SpO2 98 %. Weight change:   Wt Readings from Last 3 Encounters:  04/17/14 205 lb (92.987 kg)  07/11/13 212 lb (96.163 kg)  05/30/13 212 lb (96.163 kg)    Moderate neck vein elevation Chest with decreased bibasilar breath sounds No edema.  ASSESSMENT:  1. CAD with known total occlusion of RCA by cath 2009 and minimal LAD and CFX disease. Nuclear yesterday was low to moderate risk. Cleared for PV surgery and will be moderate to high risk. 2. Chronic diastolic/systolic heart , with ?mild volume overlaod 3. Gangrenous right foot/toes, will require surgery 4. History or  Natasha Mead with PAF inducing CHF. Prior cardioversion needed.  Plan:  Cleared for vascular surgery. Watch for volume overload.  Will follow.  Demetrios Isaacs 04/23/2014, 7:52 AM

## 2014-04-23 NOTE — Progress Notes (Signed)
[facility] [place]  Subjective  - She is ready to proceed with surgery tomorrow.   Objective 140/66 75 98.7 F (37.1 C) (Oral) 18 98%  Intake/Output Summary (Last 24 hours) at 04/23/14 1135 Last data filed at 04/23/14 0900  Gross per 24 hour  Intake 1127.5 ml  Output      6 ml  Net 1121.5 ml    Right foot redressed no change in wounds Toes 2 and 3 as well as dorsum and plantar foot below the toes Lungs non labored breathing Heart RRR  Assessment/Planning: S/P Aortogram with right lower extremity runoff, right external iliac stent (self-expanding 7 x 30 Abbott) Plan for right femoral endarterectomy and profundoplasty by Dr. Oneida Alar tomorrow Pre-op orders in place   [place], [place] 04/23/2014 11:35 AM --  Laboratory Lab Results:  Recent Labs  04/22/14 0454 04/23/14 0449  WBC 7.5 6.7  HGB 11.7* 11.3*  HCT 36.5 34.4*  PLT 404* 382   BMET  Recent Labs  04/22/14 0454 04/23/14 0449  NA 139 136  K 5.1 4.6  CL 105 102  CO2 26 24  GLUCOSE 121* 131*  BUN 23 19  CREATININE 1.09 1.07  CALCIUM 9.3 9.5    COAG Lab Results  Component Value Date   INR 1.18 04/18/2014   No results found for: PTT

## 2014-04-23 NOTE — Progress Notes (Signed)
Medical Consultation PROGRESS NOTE  Julia Goodman MLY:[id number] DOB: 01/17/1926 DOA: 04/17/2014 PCP: Moshe Cipro, MD  HPI: Julia Goodman is a 81 y.o. female who approximately 3 weeks ago injured her right foot on a wheelchair. She developed a blister and wounds on the third and fourth toes of the right foot. She was seen by her podiatrist 3/10. He discussed the case with Dr. Oneida Alar. Dr. Oneida Alar recommended that the [facility] come [facility]. Patient is admitted to vascular surgery, hospitalist consulted for medical management.  Subjective / 24 H Interval events - no complaints, doing well this morning, awaiting surgery tomorrow  Assessment/Plan: Principal Problem:   Gangrene associated with diabetes mellitus Active Problems:   COPD (chronic obstructive pulmonary disease)   CKD (chronic kidney disease), stage III   Diabetes mellitus   Atrial fibrillation   CAD in native artery   Chronic combined systolic and diastolic HF (heart failure)   DM  - controlled with most recent A1C of 6.3 - with renal and vascular complications - continue home medications with glipizide and Tradjenta, SSI - CBGs with good control COPD  - stable - she has mild wheezing likely in the setting of mild fluid overload - Lasix per cardiology, 2D echo with normal EF, LA dilatation - continue xopenex - incentive spirometry  - continue oxygen, she is on chronic O2 CKD stage III - discontinued Relafen, patient has been on this for years, scheduled three times daily - Cr at baseline  Right foot gangrene  - per vascular History of PAF  - currently in sinus rhythm on telemetry - continue sotalol - oral anticoagulation on hold, continue heprin gtt - cardiology following Lung mass / Malignancy  - followed at [facility] s/p ablation 11/2013.  - outpatient follow up, supposed to have a repeat CT scan in April  We'll continue to follow, thank you for this consult.    Diet: Diet Carb  Modified Diet NPO time specified Except for: Sips with Meds Fluids: KVO DVT Prophylaxis: heparin gtt  Code Status: Full Code Family Communication: none bedside this morning  Disposition Plan: remain inpatient  Procedures:  None    Antibiotics Vancomycin 3/10 >> Zosyn 3/10 >>   Studies  Dg Chest 2 View  04/22/2014   CLINICAL DATA:  Dyspnea  EXAM: CHEST  2 VIEW  COMPARISON:  None.  FINDINGS: There is marked cardiomegaly. Vascular and interstitial congestive changes are present. Mild basilar airspace opacities are present. The findings likely represent congestive heart failure. There probably are small effusions collected in the posterior costophrenic angles. No large effusion is evident.  IMPRESSION: Cardiomegaly.  Congestive heart failure.   Electronically Signed   By: Andreas Newport M.D.   On: 04/22/2014 04:38   Nm Myocar Multi W/spect W/wall Motion / Ef  04/22/2014   CLINICAL DATA:  Chest pain.  Preoperative examination.  EXAM: MYOCARDIAL IMAGING WITH SPECT (REST AND PHARMACOLOGIC-STRESS)  GATED LEFT VENTRICULAR WALL MOTION STUDY  LEFT VENTRICULAR EJECTION FRACTION  TECHNIQUE: Standard myocardial SPECT imaging was performed after resting intravenous injection of 10 mCi Tc-31m sestamibi. Subsequently, intravenous infusion of Lexiscan was performed under the supervision of the Cardiology staff. At peak effect of the drug, 30 mCi Tc-40m sestamibi was injected intravenously and standard myocardial SPECT imaging was performed. Quantitative gated imaging was also performed to evaluate left ventricular wall motion, and estimate left ventricular ejection fraction.  COMPARISON:  Chest radiograph- 04/11/2024  FINDINGS: Raw images: There is mild to moderate breast and chest  wall attenuation on both the provided rest and stress images. There is no significant patient motion artifact or GI attenuation.  Perfusion: There is a large territory of matched non perfusion involving primarily the inferior  wall of the left ventricle with minimal extension to involve the lateral wall, compatible with prior infarct. No mismatched areas of perfusion to suggest pharmacologically induced ischemia.  Wall Motion: Geographic hypokinesia, near akinesia involving the inferior wall of the left ventricle at the location of suspected prior infarction. Otherwise, normal wall motion.  Left Ventricular Ejection Fraction: 60 %  End diastolic volume 371 ml  End systolic volume 40 ml  IMPRESSION: 1. Large territory matched area of non perfusion involving primarily the inferior wall of the left ventricle with minimal extension to the lateral wall, scintigraphic findings compatible with prior large territory infarct. 2. No scintigraphic evidence of pharmacologically induced ischemia. 3. Expected geographic hypokinesia, near akinesia, involving the area of prior infarct involving the inferior wall of the left ventricle. Otherwise, normal wall motion with preserved ejection fraction of 60%.   Electronically Signed   By: Sandi Mariscal M.D.   On: 04/22/2014 14:01    Objective  Filed Vitals:   04/22/14 1139 04/22/14 1333 04/22/14 2027 04/23/14 0335  BP: 140/59  141/57 140/66  Pulse: 71 68 79 75  Temp:  98.1 F (36.7 C) 98.1 F (36.7 C) 98.7 F (37.1 C)  TempSrc:  Oral Oral Oral  Resp:  17 18 18   Height:      Weight:      SpO2: 94% 97% 100% 98%    Intake/Output Summary (Last 24 hours) at 04/23/14 1234 Last data filed at 04/23/14 0900  Gross per 24 hour  Intake 1127.5 ml  Output      6 ml  Net 1121.5 ml   Filed Weights   04/17/14 2128  Weight: 92.987 kg (205 lb)   Exam:  General:  NAD  HEENT: no scleral icterus  Cardiovascular: RRR without MRG  Respiratory: no wheezing, overall decreased breath sounds  Abdomen: soft, non tender  MSK/Extremities: right foot wound involving right third and fourth toe with gangrene/cellulitis/erythema/associated edema  Skin: no rashes  Neuro: non focal   Data  Reviewed: Basic Metabolic Panel:  Recent Labs Lab 04/19/14 1250 04/20/14 0553 04/21/14 0600 04/22/14 0454 04/23/14 0449  NA 139 136 137 139 136  K 5.5* 5.3* 5.7* 5.1 4.6  CL 105 103 104 105 102  CO2 28 23 16* 26 24  GLUCOSE 181* 125* 151* 121* 131*  BUN 28* 30* 32* 23 19  CREATININE 1.25* 1.25* 1.22* 1.09 1.07  CALCIUM 8.9 9.2 9.0 9.3 9.5   Liver Function Tests:  Recent Labs Lab 04/18/14 0323  AST 23  ALT 25  ALKPHOS 52  BILITOT 0.6  PROT 7.1  ALBUMIN 2.8*   CBC:  Recent Labs Lab 04/19/14 0620 04/20/14 0553 04/21/14 0600 04/22/14 0454 04/23/14 0449  WBC 4.5 5.5 6.1 7.5 6.7  HGB 11.0* 11.7* 11.5* 11.7* 11.3*  HCT 34.3* 36.2 35.5* 36.5 34.4*  MCV 95.5 95.8 95.7 95.3 94.8  PLT 293 359 320 404* 382   CBG:  Recent Labs Lab 04/22/14 1317 04/22/14 1637 04/22/14 2045 04/23/14 0618 04/23/14 1122  GLUCAP 147* 156* 149* 127* 137*    Recent Results (from the past 240 hour(s))  Blood culture (routine x 2)     Status: None (Preliminary result)   Collection Time: 04/17/14  9:42 PM  Result Value Ref Range Status   Specimen Description  BLOOD RIGHT ANTECUBITAL  Final   Special Requests BOTTLES DRAWN AEROBIC AND ANAEROBIC 5CC EACH  Final   Culture   Final           BLOOD CULTURE RECEIVED NO GROWTH TO DATE CULTURE WILL BE HELD FOR 5 DAYS BEFORE ISSUING A FINAL NEGATIVE REPORT Performed at [facility]    Report Status PENDING  Incomplete  Blood culture (routine x 2)     Status: None (Preliminary result)   Collection Time: 04/17/14 11:25 PM  Result Value Ref Range Status   Specimen Description BLOOD LEFT ANTECUBITAL  Final   Special Requests BOTTLES DRAWN AEROBIC AND ANAEROBIC 5CC EACH  Final   Culture   Final           BLOOD CULTURE RECEIVED NO GROWTH TO DATE CULTURE WILL BE HELD FOR 5 DAYS BEFORE ISSUING A FINAL NEGATIVE REPORT Performed at [facility]    Report Status PENDING  Incomplete  Urine culture     Status: None   Collection Time:  04/18/14 12:46 AM  Result Value Ref Range Status   Specimen Description URINE, RANDOM  Final   Special Requests NONE  Final   Colony Count NO GROWTH Performed at [facility]   Final   Culture NO GROWTH Performed at [facility]   Final   Report Status 04/19/2014 FINAL  Final     Scheduled Meds: . [START ON 04/24/2014] cefUROXime (ZINACEF)  IV  1.5 g Intravenous On Call to OR  . docusate sodium  100 mg Oral Daily  . escitalopram  5 mg Oral BID  . feeding supplement (GLUCERNA SHAKE)  237 mL Oral TID WC  . insulin aspart  0-15 Units Subcutaneous TID WC  . linagliptin  5 mg Oral Daily  . piperacillin-tazobactam (ZOSYN)  IV  3.375 g Intravenous Q8H  . sodium polystyrene  15 g Oral Once  . vancomycin  1,000 mg Intravenous Q24H   Continuous Infusions: . sodium chloride 600 mL (04/21/14 1657)  . heparin 1,750 Units/hr (04/23/14 0328)    Marzetta Board, MD [facility] Pager [phone]. If 7 PM - 7 AM, please contact night-coverage at [email or web address], password [facility] 04/23/2014, 12:34 PM  LOS: 5 days

## 2014-04-23 NOTE — Progress Notes (Signed)
ANTICOAGULATION CONSULT NOTE - Follow up  Pharmacy Consult for heparin Indication: atrial fibrillation  Allergies  Allergen Reactions  . Hydrocodone Anaphylaxis  . Ace Inhibitors Other (See Comments)  . Ciprofloxacin   . Ketamine Other (See Comments)    halluciantion  . Niaspan [Niacin Er] Other (See Comments)    jitters  . Zetia [Ezetimibe]     Patient Measurements: Height: 5\' 6"  (167.6 cm) Weight: 205 lb (92.987 kg) IBW/kg (Calculated) : 59.3 Heparin Dosing Weight: 80 kg  Vital Signs: Temp: 98.7 F (37.1 C) (03/16 0335) Temp Source: Oral (03/16 0335) BP: 140/66 mmHg (03/16 0335) Pulse Rate: 75 (03/16 0335)  Labs:  Recent Labs  04/21/14 0600 04/22/14 0454 04/22/14 0730 04/23/14 0449  HGB 11.5* 11.7*  --  11.3*  HCT 35.5* 36.5  --  34.4*  PLT 320 404*  --  382  HEPARINUNFRC 0.33  --  0.31 0.38  CREATININE 1.22* 1.09  --  1.07    Estimated Creatinine Clearance: 43.4 mL/min (by C-G formula based on Cr of 1.07).   Medical History: Past Medical History  Diagnosis Date  . Diabetes mellitus   . Leg pain   . COPD (chronic obstructive pulmonary disease)   . Cardiomyopathy   . Irregular heartbeat   . Coronary artery disease   . Myocardial infarction   . Peripheral vascular disease   . Cancer     Skin Melanoma  . CHF (congestive heart failure)   . Arthritis     OSTEOARTHRITIS    Assessment: 58 yof continuing on heparin IV for afib. Takes apixaban chronically for Afib and has been held for femoral endarterectomy planned for Thursday.   Anti-Xa level 0.38 this morning on 1750 units/hr. Hg stable 11.3, plt stable wnl. RN reports no s/s of bleeding    Goal of Therapy:  Heparin level 0.3-0.7 units/ml Monitor platelets by anticoagulation protocol: Yes   Plan:  Continue heparin infusion at 1750 units/hr  Monitor daily HL, CBC and s/s of bleeding  Follow up restarting oral anticoagulation - educate on interaction with Gingko Biloba which patient takes at  home   Albertina Parr, PharmD., BCPS Clinical Pharmacist Pager [phone]

## 2014-04-24 ENCOUNTER — Encounter (HOSPITAL_COMMUNITY)
Admission: EM | Disposition: A | Discharge status: Skilled Nursing Facility | Payer: Self-pay | Source: Home / Self Care | Attending: Vascular Surgery

## 2014-04-24 ENCOUNTER — Inpatient Hospital Stay (HOSPITAL_COMMUNITY): Payer: Medicare Other | Admitting: Anesthesiology

## 2014-04-24 ENCOUNTER — Telehealth: Payer: Self-pay | Admitting: Vascular Surgery

## 2014-04-24 ENCOUNTER — Encounter (HOSPITAL_COMMUNITY): Payer: Self-pay | Admitting: Anesthesiology

## 2014-04-24 DIAGNOSIS — I70234 Atherosclerosis of native arteries of right leg with ulceration of heel and midfoot: Secondary | ICD-10-CM

## 2014-04-24 HISTORY — PX: ENDARTERECTOMY FEMORAL: SHX5804

## 2014-04-24 LAB — CBC
HCT: 34.6 % — ABNORMAL LOW (ref 36.0–46.0)
Hemoglobin: 11.4 g/dL — ABNORMAL LOW (ref 12.0–15.0)
MCH: 31.3 pg (ref 26.0–34.0)
MCHC: 32.9 g/dL (ref 30.0–36.0)
MCV: 95.1 fL (ref 78.0–100.0)
PLATELETS: 397 10*3/uL (ref 150–400)
RBC: 3.64 MIL/uL — ABNORMAL LOW (ref 3.87–5.11)
RDW: 13.6 % (ref 11.5–15.5)
WBC: 6 10*3/uL (ref 4.0–10.5)

## 2014-04-24 LAB — CULTURE, BLOOD (ROUTINE X 2)
CULTURE: NO GROWTH
CULTURE: NO GROWTH

## 2014-04-24 LAB — BASIC METABOLIC PANEL
Anion gap: 13 (ref 5–15)
BUN: 15 mg/dL (ref 6–23)
CALCIUM: 9.3 mg/dL (ref 8.4–10.5)
CO2: 23 mmol/L (ref 19–32)
CREATININE: 1.19 mg/dL — AB (ref 0.50–1.10)
Chloride: 102 mmol/L (ref 96–112)
GFR calc Af Amer: 47 mL/min — ABNORMAL LOW (ref >90)
GFR, EST NON AFRICAN AMERICAN: 40 mL/min — AB (ref >90)
Glucose, Bld: 112 mg/dL — ABNORMAL HIGH (ref 70–99)
Potassium: 4.2 mmol/L (ref 3.5–5.1)
Sodium: 138 mmol/L (ref 135–145)

## 2014-04-24 LAB — GLUCOSE, CAPILLARY
GLUCOSE-CAPILLARY: 109 mg/dL — AB (ref 70–99)
GLUCOSE-CAPILLARY: 153 mg/dL — AB (ref 70–99)
Glucose-Capillary: 110 mg/dL — ABNORMAL HIGH (ref 70–99)

## 2014-04-24 LAB — HEPARIN LEVEL (UNFRACTIONATED): Heparin Unfractionated: 0.64 IU/mL (ref 0.30–0.70)

## 2014-04-24 SURGERY — ENDARTERECTOMY, FEMORAL
Anesthesia: General | Site: Groin | Laterality: Right

## 2014-04-24 MED ORDER — THROMBIN 20000 UNITS EX SOLR
CUTANEOUS | Status: AC
  Filled 2014-04-24: qty 20000

## 2014-04-24 MED ORDER — SODIUM CHLORIDE 0.9 % IR SOLN
Status: DC | PRN
  Administered 2014-04-24: 500 mL

## 2014-04-24 MED ORDER — GLYCOPYRROLATE 0.2 MG/ML IJ SOLN
INTRAMUSCULAR | Status: DC | PRN
  Administered 2014-04-24: 0.4 mg via INTRAVENOUS

## 2014-04-24 MED ORDER — NEOSTIGMINE METHYLSULFATE 10 MG/10ML IV SOLN
INTRAVENOUS | Status: DC | PRN
  Administered 2014-04-24: 3 mg via INTRAVENOUS

## 2014-04-24 MED ORDER — ONDANSETRON HCL 4 MG/2ML IJ SOLN
INTRAMUSCULAR | Status: DC | PRN
  Administered 2014-04-24: 4 mg via INTRAVENOUS

## 2014-04-24 MED ORDER — FENTANYL CITRATE 0.05 MG/ML IJ SOLN
25.0000 ug | INTRAMUSCULAR | Status: AC | PRN
  Administered 2014-04-24 (×6): 25 ug via INTRAVENOUS

## 2014-04-24 MED ORDER — PROMETHAZINE HCL 25 MG/ML IJ SOLN: 6.2500 mg | INTRAMUSCULAR | Status: DC | PRN

## 2014-04-24 MED ORDER — FENTANYL CITRATE 0.05 MG/ML IJ SOLN
INTRAMUSCULAR | Status: AC
  Filled 2014-04-24: qty 5

## 2014-04-24 MED ORDER — ARTIFICIAL TEARS OP OINT
TOPICAL_OINTMENT | OPHTHALMIC | Status: DC | PRN
  Administered 2014-04-24: 1 via OPHTHALMIC

## 2014-04-24 MED ORDER — FLORANEX PO PACK
1.0000 g | PACK | Freq: Three times a day (TID) | ORAL | Status: DC
  Administered 2014-04-25 – 2014-05-01 (×18): 1 g via ORAL
  Filled 2014-04-24 (×27): qty 1

## 2014-04-24 MED ORDER — FENTANYL CITRATE 0.05 MG/ML IJ SOLN
INTRAMUSCULAR | Status: AC
  Filled 2014-04-24: qty 2

## 2014-04-24 MED ORDER — SODIUM CHLORIDE 0.9 % IV SOLN
INTRAVENOUS | Status: DC
  Administered 2014-04-24: 21:00:00 via INTRAVENOUS

## 2014-04-24 MED ORDER — HEPARIN SODIUM (PORCINE) 1000 UNIT/ML IJ SOLN
INTRAMUSCULAR | Status: DC | PRN
  Administered 2014-04-24 (×2): 10000 [IU] via INTRAVENOUS

## 2014-04-24 MED ORDER — GLYCOPYRROLATE 0.2 MG/ML IJ SOLN
INTRAMUSCULAR | Status: AC
  Filled 2014-04-24: qty 2

## 2014-04-24 MED ORDER — DOCUSATE SODIUM 100 MG PO CAPS: 100.0000 mg | ORAL_CAPSULE | Freq: Every day | ORAL | Status: DC | PRN

## 2014-04-24 MED ORDER — THROMBIN 20000 UNITS EX SOLR
CUTANEOUS | Status: DC | PRN
  Administered 2014-04-24: 20 mL via TOPICAL

## 2014-04-24 MED ORDER — HEPARIN SODIUM (PORCINE) 1000 UNIT/ML IJ SOLN
INTRAMUSCULAR | Status: AC
  Filled 2014-04-24: qty 2

## 2014-04-24 MED ORDER — NEOSTIGMINE METHYLSULFATE 10 MG/10ML IV SOLN
INTRAVENOUS | Status: AC
  Filled 2014-04-24: qty 1

## 2014-04-24 MED ORDER — FENTANYL CITRATE 0.05 MG/ML IJ SOLN
INTRAMUSCULAR | Status: DC | PRN
  Administered 2014-04-24 (×2): 25 ug via INTRAVENOUS
  Administered 2014-04-24: 100 ug via INTRAVENOUS

## 2014-04-24 MED ORDER — LIDOCAINE HCL (CARDIAC) 20 MG/ML IV SOLN
INTRAVENOUS | Status: AC
  Filled 2014-04-24: qty 5

## 2014-04-24 MED ORDER — PROPOFOL 10 MG/ML IV BOLUS
INTRAVENOUS | Status: DC | PRN
  Administered 2014-04-24: 100 mg via INTRAVENOUS

## 2014-04-24 MED ORDER — PROTAMINE SULFATE 10 MG/ML IV SOLN
INTRAVENOUS | Status: DC | PRN
  Administered 2014-04-24: 20 mg via INTRAVENOUS
  Administered 2014-04-24 (×2): 25 mg via INTRAVENOUS
  Administered 2014-04-24: 5 mg via INTRAVENOUS
  Administered 2014-04-24: 25 mg via INTRAVENOUS

## 2014-04-24 MED ORDER — LACTATED RINGERS IV SOLN
INTRAVENOUS | Status: DC | PRN
  Administered 2014-04-24: 14:00:00 via INTRAVENOUS

## 2014-04-24 MED ORDER — LIDOCAINE HCL (CARDIAC) 20 MG/ML IV SOLN
INTRAVENOUS | Status: DC | PRN
  Administered 2014-04-24: 100 mg via INTRAVENOUS

## 2014-04-24 MED ORDER — PHENYLEPHRINE HCL 10 MG/ML IJ SOLN
10.0000 mg | INTRAVENOUS | Status: DC | PRN
  Administered 2014-04-24: 20 ug/min via INTRAVENOUS

## 2014-04-24 MED ORDER — ROCURONIUM BROMIDE 100 MG/10ML IV SOLN
INTRAVENOUS | Status: DC | PRN
  Administered 2014-04-24 (×3): 10 mg via INTRAVENOUS
  Administered 2014-04-24: 20 mg via INTRAVENOUS

## 2014-04-24 MED ORDER — 0.9 % SODIUM CHLORIDE (POUR BTL) OPTIME
TOPICAL | Status: DC | PRN
  Administered 2014-04-24: 2000 mL

## 2014-04-24 MED ORDER — ONDANSETRON HCL 4 MG/2ML IJ SOLN
INTRAMUSCULAR | Status: AC
  Filled 2014-04-24: qty 2

## 2014-04-24 SURGICAL SUPPLY — 42 items
CANISTER SUCTION 2500CC (MISCELLANEOUS) ×2 IMPLANT
CANNULA VESSEL 3MM 2 BLNT TIP (CANNULA) ×4 IMPLANT
CLIP TI MEDIUM 24 (CLIP) ×2 IMPLANT
CLIP TI WIDE RED SMALL 24 (CLIP) ×2 IMPLANT
DRAIN SNY WOU (WOUND CARE) IMPLANT
ELECT REM PT RETURN 9FT ADLT (ELECTROSURGICAL) ×2
ELECTRODE REM PT RTRN 9FT ADLT (ELECTROSURGICAL) ×1 IMPLANT
EVACUATOR SILICONE 100CC (DRAIN) IMPLANT
GLOVE BIO SURGEON STRL SZ7.5 (GLOVE) ×2 IMPLANT
GLOVE BIOGEL PI IND STRL 6.5 (GLOVE) ×5 IMPLANT
GLOVE BIOGEL PI IND STRL 7.0 (GLOVE) ×1 IMPLANT
GLOVE BIOGEL PI IND STRL 7.5 (GLOVE) ×1 IMPLANT
GLOVE BIOGEL PI INDICATOR 6.5 (GLOVE) ×5
GLOVE BIOGEL PI INDICATOR 7.0 (GLOVE) ×1
GLOVE BIOGEL PI INDICATOR 7.5 (GLOVE) ×1
GLOVE ECLIPSE 6.5 STRL STRAW (GLOVE) ×2 IMPLANT
GLOVE ECLIPSE 7.0 STRL STRAW (GLOVE) ×2 IMPLANT
GLOVE SURG SS PI 7.0 STRL IVOR (GLOVE) ×6 IMPLANT
GOWN STRL REUS W/ TWL LRG LVL3 (GOWN DISPOSABLE) ×4 IMPLANT
GOWN STRL REUS W/ TWL XL LVL3 (GOWN DISPOSABLE) ×3 IMPLANT
GOWN STRL REUS W/TWL LRG LVL3 (GOWN DISPOSABLE) ×4
GOWN STRL REUS W/TWL XL LVL3 (GOWN DISPOSABLE) ×3
KIT BASIN OR (CUSTOM PROCEDURE TRAY) ×2 IMPLANT
KIT ROOM TURNOVER OR (KITS) ×2 IMPLANT
LIQUID BAND (GAUZE/BANDAGES/DRESSINGS) ×2 IMPLANT
NS IRRIG 1000ML POUR BTL (IV SOLUTION) ×4 IMPLANT
PACK PERIPHERAL VASCULAR (CUSTOM PROCEDURE TRAY) ×2 IMPLANT
PAD ARMBOARD 7.5X6 YLW CONV (MISCELLANEOUS) ×4 IMPLANT
SPONGE SURGIFOAM ABS GEL 100 (HEMOSTASIS) ×2 IMPLANT
STAPLER VISISTAT 35W (STAPLE) IMPLANT
SUT ETHILON 3 0 PS 1 (SUTURE) IMPLANT
SUT PROLENE 5 0 C 1 24 (SUTURE) ×2 IMPLANT
SUT PROLENE 6 0 CC (SUTURE) ×2 IMPLANT
SUT PROLENE 7 0 BV 1 (SUTURE) ×4 IMPLANT
SUT PROLENE 7 0 BV1 MDA (SUTURE) ×4 IMPLANT
SUT VIC AB 2-0 CTX 36 (SUTURE) ×4 IMPLANT
SUT VIC AB 3-0 SH 27 (SUTURE) ×1
SUT VIC AB 3-0 SH 27X BRD (SUTURE) ×1 IMPLANT
SUT VICRYL 4-0 PS2 18IN ABS (SUTURE) ×2 IMPLANT
TRAY FOLEY CATH 16FRSI W/METER (SET/KITS/TRAYS/PACK) ×2 IMPLANT
UNDERPAD 30X30 INCONTINENT (UNDERPADS AND DIAPERS) ×2 IMPLANT
WATER STERILE IRR 1000ML POUR (IV SOLUTION) ×2 IMPLANT

## 2014-04-24 NOTE — H&P (View-Only) (Signed)
Medical Consultation PROGRESS NOTE  Julia Goodman OIN:[id number] DOB: 05-27-1925 DOA: 04/17/2014 PCP: Moshe Cipro, MD  HPI: Julia Goodman is a 81 y.o. female who approximately 3 weeks ago injured her right foot on a wheelchair. She developed a blister and wounds on the third and fourth toes of the right foot. She was seen by her podiatrist 3/10. He discussed the case with Dr. Oneida Alar. Dr. Oneida Alar recommended that the [facility] come to [facility]. Patient is admitted to vascular surgery, hospitalist consulted for medical management.  Subjective / 24 H Interval events - no complaints, doing well this morning, awaiting surgery tomorrow  Assessment/Plan: Principal Problem:   Gangrene associated with diabetes mellitus Active Problems:   COPD (chronic obstructive pulmonary disease)   CKD (chronic kidney disease), stage III   Diabetes mellitus   Atrial fibrillation   CAD in native artery   Chronic combined systolic and diastolic HF (heart failure)   DM  - controlled with most recent A1C of 6.3 - with renal and vascular complications - continue home medications with glipizide and Tradjenta, SSI - CBGs with good control COPD  - stable - she has mild wheezing likely in the setting of mild fluid overload - Lasix per cardiology, 2D echo with normal EF, LA dilatation - continue xopenex - incentive spirometry  - continue oxygen, she is on chronic O2 CKD stage III - discontinued Relafen, patient has been on this for years, scheduled three times daily - Cr at baseline  Right foot gangrene  - per vascular History of PAF  - currently in sinus rhythm on telemetry - continue sotalol - oral anticoagulation on hold, continue heprin gtt - cardiology following Lung mass / Malignancy  - followed at [facility] s/p ablation 11/2013.  - outpatient follow up, supposed to have a repeat CT scan in April  We'll continue to follow, thank you for this consult.    Diet: Diet Carb  Modified Diet NPO time specified Except for: Sips with Meds Fluids: KVO DVT Prophylaxis: heparin gtt  Code Status: Full Code Family Communication: none bedside this morning  Disposition Plan: remain inpatient  Procedures:  None    Antibiotics Vancomycin 3/10 >> Zosyn 3/10 >>   Studies  Dg Chest 2 View  04/22/2014   CLINICAL DATA:  Dyspnea  EXAM: CHEST  2 VIEW  COMPARISON:  None.  FINDINGS: There is marked cardiomegaly. Vascular and interstitial congestive changes are present. Mild basilar airspace opacities are present. The findings likely represent congestive heart failure. There probably are small effusions collected in the posterior costophrenic angles. No large effusion is evident.  IMPRESSION: Cardiomegaly.  Congestive heart failure.   Electronically Signed   By: Andreas Newport M.D.   On: 04/22/2014 04:38   Nm Myocar Multi W/spect W/wall Motion / Ef  04/22/2014   CLINICAL DATA:  Chest pain.  Preoperative examination.  EXAM: MYOCARDIAL IMAGING WITH SPECT (REST AND PHARMACOLOGIC-STRESS)  GATED LEFT VENTRICULAR WALL MOTION STUDY  LEFT VENTRICULAR EJECTION FRACTION  TECHNIQUE: Standard myocardial SPECT imaging was performed after resting intravenous injection of 10 mCi Tc-52m sestamibi. Subsequently, intravenous infusion of Lexiscan was performed under the supervision of the Cardiology staff. At peak effect of the drug, 30 mCi Tc-54m sestamibi was injected intravenously and standard myocardial SPECT imaging was performed. Quantitative gated imaging was also performed to evaluate left ventricular wall motion, and estimate left ventricular ejection fraction.  COMPARISON:  Chest radiograph- 04/11/2024  FINDINGS: Raw images: There is mild to moderate breast and chest  wall attenuation on both the provided rest and stress images. There is no significant patient motion artifact or GI attenuation.  Perfusion: There is a large territory of matched non perfusion involving primarily the inferior  wall of the left ventricle with minimal extension to involve the lateral wall, compatible with prior infarct. No mismatched areas of perfusion to suggest pharmacologically induced ischemia.  Wall Motion: Geographic hypokinesia, near akinesia involving the inferior wall of the left ventricle at the location of suspected prior infarction. Otherwise, normal wall motion.  Left Ventricular Ejection Fraction: 60 %  End diastolic volume 962 ml  End systolic volume 40 ml  IMPRESSION: 1. Large territory matched area of non perfusion involving primarily the inferior wall of the left ventricle with minimal extension to the lateral wall, scintigraphic findings compatible with prior large territory infarct. 2. No scintigraphic evidence of pharmacologically induced ischemia. 3. Expected geographic hypokinesia, near akinesia, involving the area of prior infarct involving the inferior wall of the left ventricle. Otherwise, normal wall motion with preserved ejection fraction of 60%.   Electronically Signed   By: Sandi Mariscal M.D.   On: 04/22/2014 14:01    Objective  Filed Vitals:   04/22/14 1139 04/22/14 1333 04/22/14 2027 04/23/14 0335  BP: 140/59  141/57 140/66  Pulse: 71 68 79 75  Temp:  98.1 F (36.7 C) 98.1 F (36.7 C) 98.7 F (37.1 C)  TempSrc:  Oral Oral Oral  Resp:  17 18 18   Height:      Weight:      SpO2: 94% 97% 100% 98%    Intake/Output Summary (Last 24 hours) at 04/23/14 1234 Last data filed at 04/23/14 0900  Gross per 24 hour  Intake 1127.5 ml  Output      6 ml  Net 1121.5 ml   Filed Weights   04/17/14 2128  Weight: 92.987 kg (205 lb)   Exam:  General:  NAD  HEENT: no scleral icterus  Cardiovascular: RRR without MRG  Respiratory: no wheezing, overall decreased breath sounds  Abdomen: soft, non tender  MSK/Extremities: right foot wound involving right third and fourth toe with gangrene/cellulitis/erythema/associated edema  Skin: no rashes  Neuro: non focal   Data  Reviewed: Basic Metabolic Panel:  Recent Labs Lab 04/19/14 1250 04/20/14 0553 04/21/14 0600 04/22/14 0454 04/23/14 0449  NA 139 136 137 139 136  K 5.5* 5.3* 5.7* 5.1 4.6  CL 105 103 104 105 102  CO2 28 23 16* 26 24  GLUCOSE 181* 125* 151* 121* 131*  BUN 28* 30* 32* 23 19  CREATININE 1.25* 1.25* 1.22* 1.09 1.07  CALCIUM 8.9 9.2 9.0 9.3 9.5   Liver Function Tests:  Recent Labs Lab 04/18/14 0323  AST 23  ALT 25  ALKPHOS 52  BILITOT 0.6  PROT 7.1  ALBUMIN 2.8*   CBC:  Recent Labs Lab 04/19/14 0620 04/20/14 0553 04/21/14 0600 04/22/14 0454 04/23/14 0449  WBC 4.5 5.5 6.1 7.5 6.7  HGB 11.0* 11.7* 11.5* 11.7* 11.3*  HCT 34.3* 36.2 35.5* 36.5 34.4*  MCV 95.5 95.8 95.7 95.3 94.8  PLT 293 359 320 404* 382   CBG:  Recent Labs Lab 04/22/14 1317 04/22/14 1637 04/22/14 2045 04/23/14 0618 04/23/14 1122  GLUCAP 147* 156* 149* 127* 137*    Recent Results (from the past 240 hour(s))  Blood culture (routine x 2)     Status: None (Preliminary result)   Collection Time: 04/17/14  9:42 PM  Result Value Ref Range Status   Specimen Description  BLOOD RIGHT ANTECUBITAL  Final   Special Requests BOTTLES DRAWN AEROBIC AND ANAEROBIC 5CC EACH  Final   Culture   Final           BLOOD CULTURE RECEIVED NO GROWTH TO DATE CULTURE WILL BE HELD FOR 5 DAYS BEFORE ISSUING A FINAL NEGATIVE REPORT Performed at [facility]    Report Status PENDING  Incomplete  Blood culture (routine x 2)     Status: None (Preliminary result)   Collection Time: 04/17/14 11:25 PM  Result Value Ref Range Status   Specimen Description BLOOD LEFT ANTECUBITAL  Final   Special Requests BOTTLES DRAWN AEROBIC AND ANAEROBIC 5CC EACH  Final   Culture   Final           BLOOD CULTURE RECEIVED NO GROWTH TO DATE CULTURE WILL BE HELD FOR 5 DAYS BEFORE ISSUING A FINAL NEGATIVE REPORT Performed at [facility]    Report Status PENDING  Incomplete  Urine culture     Status: None   Collection Time:  04/18/14 12:46 AM  Result Value Ref Range Status   Specimen Description URINE, RANDOM  Final   Special Requests NONE  Final   Colony Count NO GROWTH Performed at [facility]   Final   Culture NO GROWTH Performed at [facility]   Final   Report Status 04/19/2014 FINAL  Final     Scheduled Meds: . [START ON 04/24/2014] cefUROXime (ZINACEF)  IV  1.5 g Intravenous On Call to OR  . docusate sodium  100 mg Oral Daily  . escitalopram  5 mg Oral BID  . feeding supplement (GLUCERNA SHAKE)  237 mL Oral TID WC  . insulin aspart  0-15 Units Subcutaneous TID WC  . linagliptin  5 mg Oral Daily  . piperacillin-tazobactam (ZOSYN)  IV  3.375 g Intravenous Q8H  . sodium polystyrene  15 g Oral Once  . vancomycin  1,000 mg Intravenous Q24H   Continuous Infusions: . sodium chloride 600 mL (04/21/14 1657)  . heparin 1,750 Units/hr (04/23/14 0328)    Marzetta Board, MD [facility] Pager [phone]. If 7 PM - 7 AM, please contact night-coverage at [email or web address], password [facility] 04/23/2014, 12:34 PM  LOS: 5 days

## 2014-04-24 NOTE — Anesthesia Procedure Notes (Signed)
Procedure Name: Intubation Date/Time: 04/24/2014 2:08 PM Performed by: Susa Loffler Pre-anesthesia Checklist: Patient identified, Timeout performed, Emergency Drugs available, Suction available and Patient being monitored Patient Re-evaluated:Patient Re-evaluated prior to inductionOxygen Delivery Method: Circle system utilized Preoxygenation: Pre-oxygenation with 100% oxygen Intubation Type: IV induction Ventilation: Mask ventilation without difficulty Laryngoscope Size: Mac and 3 Grade View: Grade I Tube type: Oral Tube size: 7.5 mm Number of attempts: 1 Airway Equipment and Method: Stylet Placement Confirmation: ETT inserted through vocal cords under direct vision,  positive ETCO2 and breath sounds checked- equal and bilateral Secured at: 21 cm Tube secured with: Tape Dental Injury: Teeth and Oropharynx as per pre-operative assessment

## 2014-04-24 NOTE — Progress Notes (Signed)
ANTICOAGULATION CONSULT NOTE - Follow up  Pharmacy Consult for heparin Indication: atrial fibrillation  Allergies  Allergen Reactions  . Hydrocodone Anaphylaxis  . Ace Inhibitors Other (See Comments)  . Ciprofloxacin   . Ketamine Other (See Comments)    halluciantion  . Niaspan [Niacin Er] Other (See Comments)    jitters  . Zetia [Ezetimibe]     Patient Measurements: Height: 5\' 6"  (167.6 cm) Weight: 208 lb 14.4 oz (94.756 kg) IBW/kg (Calculated) : 59.3 Heparin Dosing Weight: 80 kg  Vital Signs: Temp: 98.4 F (36.9 C) (03/17 0459) Temp Source: Oral (03/17 0459) BP: 155/56 mmHg (03/17 0459) Pulse Rate: 70 (03/17 0459)  Labs:  Recent Labs  04/22/14 0454 04/22/14 0730 04/23/14 0449 04/24/14 0547  HGB 11.7*  --  11.3* 11.4*  HCT 36.5  --  34.4* 34.6*  PLT 404*  --  382 397  HEPARINUNFRC  --  0.31 0.38 0.64  CREATININE 1.09  --  1.07 1.19*    Estimated Creatinine Clearance: 39.4 mL/min (by C-G formula based on Cr of 1.19).   Medical History: Past Medical History  Diagnosis Date  . Diabetes mellitus   . Leg pain   . COPD (chronic obstructive pulmonary disease)   . Cardiomyopathy   . Irregular heartbeat   . Coronary artery disease   . Myocardial infarction   . Peripheral vascular disease   . Cancer     Skin Melanoma  . CHF (congestive heart failure)   . Arthritis     OSTEOARTHRITIS    Assessment: 37 yof continuing on heparin IV for afib. Takes apixaban chronically for Afib and has been held for femoral endarterectomy planned for today   Anti-Xa level 0.64 this morning on 1750 units/hr. Hg stable 11.4, plt stable wnl. RN reports no s/s of bleeding   Goal of Therapy:  Heparin level 0.3-0.7 units/ml Monitor platelets by anticoagulation protocol: Yes   Plan:  Continue heparin infusion at 1750 units/hr  Monitor daily HL, CBC and s/s of bleeding  Follow up restarting oral anticoagulation - educate on interaction with Gingko Biloba which patient takes at  home   Albertina Parr, PharmD., BCPS Clinical Pharmacist Pager [phone]

## 2014-04-24 NOTE — Interval H&P Note (Signed)
History and Physical Interval Note:  04/24/2014 1:54 PM  Julia Goodman  has presented today for surgery, with the diagnosis of Peripheral vascular disease with nonhealing right foot wound I70.234  The various methods of treatment have been discussed with the patient and family. After consideration of risks, benefits and other options for treatment, the patient has consented to  Procedure(s): RIGHT FEMORAL ARTERY ENDARTERECTOMY (Right) as a surgical intervention .  The patient's history has been reviewed, patient examined, no change in status, stable for surgery.  I have reviewed the patient's chart and labs.  Questions were answered to the patient's satisfaction.     Dalaysia Harms E

## 2014-04-24 NOTE — Transfer of Care (Signed)
Immediate Anesthesia Transfer of Care Note  Patient: Julia Goodman  Procedure(s) Performed: Procedure(s): RIGHT FEMORAL ARTERY ENDARTERECTOMY WITH PROFUNDOPLASTY USING VEIN (Right)  Patient Location: PACU  Anesthesia Type:General  Level of Consciousness: awake, alert  and patient cooperative  Airway & Oxygen Therapy: Patient Spontanous Breathing and Patient connected to face mask oxygen  Post-op Assessment: Report given to RN, Post -op Vital signs reviewed and stable and Patient moving all extremities  Post vital signs: Reviewed and stable  Last Vitals:  Filed Vitals:   04/24/14 0459  BP: 155/56  Pulse: 70  Temp: 36.9 C  Resp: 16    Complications: No apparent anesthesia complications

## 2014-04-24 NOTE — Progress Notes (Signed)
In OR.  Will check back later or in AM.

## 2014-04-24 NOTE — Telephone Encounter (Signed)
-----   Message from Mena Goes, RN sent at 04/22/2014 10:00 AM EDT ----- Regarding: Schedule  Patient sees a Dr. Orpah Greek in [place] for cardiology according to [facility] care teams  ----- Message -----    From: Elam Dutch, MD    Sent: 04/21/2014   1:53 PM      To: Vvs Charge Pool  Aortogram with right leg runoff Right external iliac artery stent   She will need cardiac eval then possible right femoral endarterectomy  Ruta Hinds

## 2014-04-24 NOTE — Clinical Social Work Placement (Signed)
Clinical Social Work Department BRIEF PSYCHOSOCIAL ASSESSMENT 04/24/2014  Patient:  Julia Goodman, Julia Goodman     Account Number:  [id number]     Admit date:  04/17/2014  Clinical Social Worker:  Domenica Reamer, [place]  Date/Time:  04/24/2014 01:22 PM  Referred by:  Physician  Date Referred:  04/24/2014 Referred for  SNF Placement   Other Referral:   Interview type:  Patient Other interview type:    PSYCHOSOCIAL DATA Living Status:  ALONE Admitted from facility:   Level of care:   Primary support name:  Ulyses Amor Primary support relationship to patient:  CHILD, ADULT Degree of support available:   High level of support from patients daughter    CURRENT CONCERNS Current Concerns  Post-Acute Placement   Other Concerns:    SOCIAL WORK ASSESSMENT / PLAN CSW spoke with patient and patients daughter at bedside- patient is agreeable to SNF and understands she might be less mobile following her surgery and would benefit from short term rehab.  Pt daughter reports pt will need IV medicaitons at time of DC and she states that she is not comfortable administering these medications.  Patinet is about to go to surgery and is nervous but hopeful everything will go well.   Assessment/plan status:  Psychosocial Support/Ongoing Assessment of Needs Other assessment/ plan:   FL2  PASAR   Information/referral to community resources:    PATIENT'S/FAMILY'S RESPONSE TO PLAN OF CARE: Patient and family are agreeable to SNF placement for short term rehab in the hopes that the patient will be able to return to living independently after rehab.       Domenica Reamer, [place] Social Worker [phone]

## 2014-04-24 NOTE — Progress Notes (Signed)
NUTRITION FOLLOW-UP  DOCUMENTATION CODES Per approved criteria  -Obesity Unspecified   INTERVENTION: Continue Glucerna Shake po TID, each supplement provides 220 kcal and 10 grams of protein.  Encourage adequate PO intake.  NUTRITION DIAGNOSIS: Increased nutrient needs related to chronic illness, foot infection as evidenced by estimated nutrition needs; ongoing.   Goal: Pt to meet >/= 90% of their estimated nutrition needs; met.   Monitor:  PO intake, weight trends, labs, I/O's  ASSESSMENT: Pt who approximately 3 weeks ago injured her right foot on a wheelchair. She developed a blister and wounds on the third and fourth toes of the right foot. Pt with gangrene of fourth and third toes of right foot. Pt with PMH of DM, COPD, CAD, CHF, skin melanoma.  Pt currently NPO for femoral endarterectomy today.  Per pt and family, appetite great and likes Glucerna. Meal completion 75-100%   Height: Ht Readings from Last 1 Encounters:  04/17/14 _0  (1.676 m)    Weight: Wt Readings from Last 1 Encounters:  04/24/14 208 lb 14.4 oz (94.756 kg)    BMI:  Body mass index is 33.73 kg/(m^2). Class I obesity  Estimated Nutritional Needs: Kcal: 1900-2100 Protein: 100-115 grams Fluid: 1.9 - 2.1 L/day  Skin: +1 RLE edema  Diet Order: Diet NPO time specified Except for: Sips with Meds  EDUCATION NEEDS: -No education needs identified at this time   Intake/Output Summary (Last 24 hours) at 04/24/14 1228 Last data filed at 04/24/14 0800  Gross per 24 hour  Intake    600 ml  Output      0 ml  Net    600 ml    Last BM: 3/17  Labs:   Recent Labs Lab 04/22/14 0454 04/23/14 0449 04/24/14 0547  NA 139 136 138  K 5.1 4.6 4.2  CL 105 102 102  CO2 _1 BUN _2 CREATININE 1.09 1.07 1.19*  CALCIUM 9.3 9.5 9.3  GLUCOSE 121* 131* 112*    CBG (last 3)   Recent Labs  04/23/14 2120 04/24/14 0558 04/24/14 1114  GLUCAP 105* 109* 110*    Scheduled Meds: .  escitalopram  5 mg Oral BID  . feeding supplement (GLUCERNA SHAKE)  237 mL Oral TID WC  . insulin aspart  0-15 Units Subcutaneous TID WC  . lactobacillus  1 g Oral TID WC  . linagliptin  5 mg Oral Daily  . piperacillin-tazobactam (ZOSYN)  IV  3.375 g Intravenous Q8H  . sodium polystyrene  15 g Oral Once  . vancomycin  1,000 mg Intravenous Q24H    Continuous Infusions: . sodium chloride 600 mL (04/21/14 1657)  . heparin Stopped (04/24/14 0720)   [place], [place], CNSC [phone] Pager [phone] After Hours Pager

## 2014-04-24 NOTE — Anesthesia Preprocedure Evaluation (Addendum)
Anesthesia Evaluation  Patient identified by MRN, date of birth, ID band Patient awake    Reviewed: Allergy & Precautions, NPO status , Patient's Chart, lab work & pertinent test results  Airway Mallampati: II  TM Distance: >3 FB Neck ROM: Full    Dental  (+) Teeth Intact, Dental Advisory Given   Pulmonary COPD oxygen dependent, former smoker,  Pt wears O2 @ 3L all the time         Cardiovascular + CAD, + Past MI, + Peripheral Vascular Disease and +CHF + dysrhythmias Atrial Fibrillation Rhythm:Regular Rate:Normal  Echo (04-22-14): EF 55-60%, LA severaly dilated , mild MR Nuclear ST (04-22-14): no ischemia, normal EF    Neuro/Psych    GI/Hepatic negative GI ROS, Neg liver ROS,   Endo/Other  diabetes, Type 2, Oral Hypoglycemic AgentsGlucose 110  Renal/GU Renal disease CKD stage III (Cr 1.19)     Musculoskeletal  (+) Arthritis -,   Abdominal   Peds  Hematology  (+) anemia , H/H 11.4/34.6   Anesthesia Other Findings   Reproductive/Obstetrics                           Anesthesia Physical Anesthesia Plan  ASA: III  Anesthesia Plan: General   Post-op Pain Management:    Induction: Intravenous  Airway Management Planned: Oral ETT  Additional Equipment:   Intra-op Plan:   Post-operative Plan: Extubation in OR  Informed Consent: I have reviewed the patients History and Physical, chart, labs and discussed the procedure including the risks, benefits and alternatives for the proposed anesthesia with the patient or authorized representative who has indicated his/her understanding and acceptance.   Dental advisory given  Plan Discussed with: Anesthesiologist and Surgeon  Anesthesia Plan Comments:       Anesthesia Quick Evaluation

## 2014-04-24 NOTE — Progress Notes (Signed)
Right hand/wrist area noted to be reddened, and fingers sl. Swollen. Cap refill < 3sec, 3+ right radial pulse. Pt has no c/o, appears comfortable and sleeps when left alone after pain med. CRNA Trixie Deis says it was this way in the OR-pt has 2 NSL in RUE that were not used during the case. BP cuff has been on RUE-now switched to LUE. RUE elevated on a pillow and will continue to monitor.

## 2014-04-24 NOTE — Progress Notes (Signed)
Per bed control 3S bed will not be available till a pt transfers out to 4N and rm cleaned. Daughter fully updated and aware it will probably be after 11pm till Julia Goodman leaves PACU. Family going home now, # to 3S given to family.

## 2014-04-24 NOTE — Progress Notes (Signed)
Report to M. Lovena Le RN as primary caregiver as pt still waiting on a stepdown bed.

## 2014-04-24 NOTE — Telephone Encounter (Signed)
Left message for appointment desk at Dr Sanjuan Dame office [phone]. Awaiting call back. dpm

## 2014-04-24 NOTE — Progress Notes (Signed)
Medical Consultation PROGRESS NOTE  ADALYNN CORNE VQQ:[id number] DOB: 1925/03/20 DOA: 04/17/2014 PCP: Moshe Cipro, MD  HPI: Julia Goodman is a 81 y.o. female who approximately 3 weeks ago injured her right foot on a wheelchair. She developed a blister and wounds on the third and fourth toes of the right foot. She was seen by her podiatrist 3/10. He discussed the case with Dr. Oneida Alar. Dr. Oneida Alar recommended [facility]. Patient is admitted to vascular surgery, hospitalist consulted for medical management.  Subjective / 24 H Interval events - reported right foot pain, loose stool, doing well this morning, awaiting surgery. Daughter at bedside.  Assessment/Plan: Principal Problem:   Gangrene associated with diabetes mellitus Active Problems:   COPD (chronic obstructive pulmonary disease)   CKD (chronic kidney disease), stage III   Diabetes mellitus   Atrial fibrillation   CAD in native artery   Chronic combined systolic and diastolic HF (heart failure)  Loose stool: no fever, no leukocytosis, no ab pain, d/c scheduled stool softener, consider c diff testing if develop diarrhea. Patient has  Been on abx.   DM  - controlled with most recent A1C of 6.3 - with renal and vascular complications - continue home medications with glipizide and Tradjenta, SSI - CBGs with good control COPD  - stable - she has mild wheezing likely in the setting of mild fluid overload - Lasix per cardiology, 2D echo with normal EF, LA dilatation - continue xopenex - incentive spirometry  - continue oxygen, she is on chronic O2 CKD stage III - discontinued Relafen, patient has been on this for years, scheduled three times daily - Cr at baseline  Right foot gangrene  - per vascular History of PAF  - currently in sinus rhythm on telemetry - - oral anticoagulation on hold, continue heprin gtt - cardiology please address the need of sotalol, it seems that sotalol was  stopped on 3/14.   Lung mass / Malignancy  - followed at [facility] s/p ablation 11/2013.  - outpatient follow up, supposed to have a repeat CT scan in April  We'll continue to follow, thank you for this consult.    Diet: Diet NPO time specified Except for: Sips with Meds Fluids: KVO DVT Prophylaxis: heparin gtt  Code Status: Full Code Family Communication: none bedside this morning  Disposition Plan: remain inpatient  Procedures:  None    Antibiotics Vancomycin 3/10 >> Zosyn 3/10 >>   Studies  Nm Myocar Multi W/spect W/wall Motion / Ef  04/22/2014   CLINICAL DATA:  Chest pain.  Preoperative examination.  EXAM: MYOCARDIAL IMAGING WITH SPECT (REST AND PHARMACOLOGIC-STRESS)  GATED LEFT VENTRICULAR WALL MOTION STUDY  LEFT VENTRICULAR EJECTION FRACTION  TECHNIQUE: Standard myocardial SPECT imaging was performed after resting intravenous injection of 10 mCi Tc-58m sestamibi. Subsequently, intravenous infusion of Lexiscan was performed under the supervision of the Cardiology staff. At peak effect of the drug, 30 mCi Tc-53m sestamibi was injected intravenously and standard myocardial SPECT imaging was performed. Quantitative gated imaging was also performed to evaluate left ventricular wall motion, and estimate left ventricular ejection fraction.  COMPARISON:  Chest radiograph- 04/11/2024  FINDINGS: Raw images: There is mild to moderate breast and chest wall attenuation on both the provided rest and stress images. There is no significant patient motion artifact or GI attenuation.  Perfusion: There is a large territory of matched non perfusion involving primarily the inferior wall of the left ventricle with minimal extension to involve the  lateral wall, compatible with prior infarct. No mismatched areas of perfusion to suggest pharmacologically induced ischemia.  Wall Motion: Geographic hypokinesia, near akinesia involving the inferior wall of the left ventricle at the location of suspected prior  infarction. Otherwise, normal wall motion.  Left Ventricular Ejection Fraction: 60 %  End diastolic volume 354 ml  End systolic volume 40 ml  IMPRESSION: 1. Large territory matched area of non perfusion involving primarily the inferior wall of the left ventricle with minimal extension to the lateral wall, scintigraphic findings compatible with prior large territory infarct. 2. No scintigraphic evidence of pharmacologically induced ischemia. 3. Expected geographic hypokinesia, near akinesia, involving the area of prior infarct involving the inferior wall of the left ventricle. Otherwise, normal wall motion with preserved ejection fraction of 60%.   Electronically Signed   By: Sandi Mariscal M.D.   On: 04/22/2014 14:01    Objective  Filed Vitals:   04/23/14 0335 04/23/14 1340 04/23/14 2126 04/24/14 0459  BP: 140/66 123/50 151/67 155/56  Pulse: 75 72 72 70  Temp: 98.7 F (37.1 C) 98.1 F (36.7 C) 97.5 F (36.4 C) 98.4 F (36.9 C)  TempSrc: Oral Oral Oral Oral  Resp: 18 18 18 16   Height:      Weight:    94.756 kg (208 lb 14.4 oz)  SpO2: 98% 98% 97% 97%    Intake/Output Summary (Last 24 hours) at 04/24/14 0833 Last data filed at 04/23/14 1700  Gross per 24 hour  Intake    600 ml  Output      3 ml  Net    597 ml   [facility] Weights   04/17/14 2128 04/24/14 0459  Weight: 92.987 kg (205 lb) 94.756 kg (208 lb 14.4 oz)   Exam:  General:  NAD  HEENT: no scleral icterus  Cardiovascular: RRR without MRG  Respiratory: no wheezing, overall decreased breath sounds  Abdomen: soft, non tender  MSK/Extremities: right foot wound involving right third and fourth toe with gangrene/cellulitis/erythema/associated edema  Skin: no rashes  Neuro: non focal   Data Reviewed: Basic Metabolic Panel:  Recent Labs Lab 04/20/14 0553 04/21/14 0600 04/22/14 0454 04/23/14 0449 04/24/14 0547  NA 136 137 139 136 138  K 5.3* 5.7* 5.1 4.6 4.2  CL 103 104 105 102 102  CO2 23 16* 26 24 23   GLUCOSE 125*  151* 121* 131* 112*  BUN 30* 32* 23 19 15   CREATININE 1.25* 1.22* 1.09 1.07 1.19*  CALCIUM 9.2 9.0 9.3 9.5 9.3   Liver Function Tests:  Recent Labs Lab 04/18/14 0323  AST 23  ALT 25  ALKPHOS 52  BILITOT 0.6  PROT 7.1  ALBUMIN 2.8*   CBC:  Recent Labs Lab 04/20/14 0553 04/21/14 0600 04/22/14 0454 04/23/14 0449 04/24/14 0547  WBC 5.5 6.1 7.5 6.7 6.0  HGB 11.7* 11.5* 11.7* 11.3* 11.4*  HCT 36.2 35.5* 36.5 34.4* 34.6*  MCV 95.8 95.7 95.3 94.8 95.1  PLT 359 320 404* 382 397   CBG:  Recent Labs Lab 04/23/14 0618 04/23/14 1122 04/23/14 1643 04/23/14 2120 04/24/14 0558  GLUCAP 127* 137* 154* 105* 109*    Recent Results (from the past 240 hour(s))  Blood culture (routine x 2)     Status: None   Collection Time: 04/17/14  9:42 PM  Result Value Ref Range Status   Specimen Description BLOOD RIGHT ANTECUBITAL  Final   Special Requests BOTTLES DRAWN AEROBIC AND ANAEROBIC [facility] EACH  Final   Culture   Final  NO GROWTH 5 DAYS Performed at [facility]    Report Status 04/24/2014 FINAL  Final  Blood culture (routine x 2)     Status: None   Collection Time: 04/17/14 11:25 PM  Result Value Ref Range Status   Specimen Description BLOOD LEFT ANTECUBITAL  Final   Special Requests BOTTLES DRAWN AEROBIC AND ANAEROBIC 5CC EACH  Final   Culture   Final    NO GROWTH 5 DAYS Performed at [facility]    Report Status 04/24/2014 FINAL  Final  Urine culture     Status: None   Collection Time: 04/18/14 12:46 AM  Result Value Ref Range Status   Specimen Description URINE, RANDOM  Final   Special Requests NONE  Final   Colony Count NO GROWTH Performed at [facility]   Final   Culture NO GROWTH Performed at [facility]   Final   Report Status 04/19/2014 FINAL  Final     Scheduled Meds: . escitalopram  5 mg Oral BID  . feeding supplement (GLUCERNA SHAKE)  237 mL Oral TID WC  . insulin aspart  0-15 Units Subcutaneous TID WC  .  lactobacillus  1 g Oral TID WC  . linagliptin  5 mg Oral Daily  . piperacillin-tazobactam (ZOSYN)  IV  3.375 g Intravenous Q8H  . sodium polystyrene  15 g Oral Once  . vancomycin  1,000 mg Intravenous Q24H   Continuous Infusions: . sodium chloride 600 mL (04/21/14 1657)  . heparin 1,750 Units/hr (04/23/14 1733)    Florencia Reasons, MD/PhD [facility] Pager [phone]. If 7 PM - 7 AM, please contact night-coverage at [email or web address], password [facility] 04/24/2014, 8:33 AM  LOS: 6 days

## 2014-04-24 NOTE — Progress Notes (Signed)
Pt still sleepy in PACU  Filed Vitals:   04/24/14 1848 04/24/14 1900 04/24/14 1905 04/24/14 1919  BP: 159/73  161/72 151/72  Pulse:  99 98 94  Temp:      TempSrc:      Resp:  15 16 16   Height:      Weight:      SpO2:  98% 95% 93%    2+ right DP pulse  Awaiting bed Stable  Ruta Hinds, MD [facility] [place] Office: [phone] Pager: [phone]

## 2014-04-24 NOTE — Clinical Social Work Placement (Addendum)
Clinical Social Work Department CLINICAL SOCIAL WORK PLACEMENT NOTE 04/24/2014  Patient:  Julia Goodman, Julia Goodman  Account Number:  [id number] [place] date:  04/17/2014  Clinical Social Worker:  Domenica Reamer, CLINICAL SOCIAL WORKER  Date/time:  04/24/2014 01:28 PM  Clinical Social Work is seeking post-discharge placement for this patient at the following level of care:   SKILLED NURSING   (*CSW will update this form in Epic as items are completed)   04/24/2014  Patient/family provided with [place] Department of Clinical Social Work's list of facilities offering this level of care within the geographic area requested by the patient (or if unable, by the patient's family).  04/24/2014  Patient/family informed of their freedom to choose among providers that offer the needed level of care, that participate in Medicare, Medicaid or managed care program needed by the patient, have an available bed and are willing to accept the patient.  04/24/2014  Patient/family informed of [facility]' ownership interest in [facility], as well as of the fact that they are under no obligation to receive care at this facility.  PASARR submitted to EDS on 04/24/2014 PASARR number received on 04/24/2014  FL2 transmitted to all facilities in geographic area requested by pt/family on  04/24/2014 FL2 transmitted to all facilities within larger geographic area on   Patient informed that his/her managed care company has contracts with or will negotiate with  certain facilities, including the following:     Patient/family informed of bed offers received: 04/30/14 Evette Cristal, MSW, LCSWA updated 05/01/14)  Patient chooses bed at [facility] [place] (Evette Cristal, MSW, LCSWA updated 05/01/14)  Physician recommends and patient chooses bed at    Patient to be transferred to  on  [facility] and [facility] (Evette Cristal, MSW, LCSWA updated 05/01/14)  Patient to be transferred to facility by patient's  daughter Evette Cristal, MSW, LCSWA updated 05/01/14)  Patient and family notified of transfer on 05/01/14 Evette Cristal, MSW, [place] updated 05/01/14)   Name of family member notified: Jacqeline Broers (Evette Cristal, MSW, LCSWA updated 05/01/14)    The following physician request were entered in Epic:   Additional Comments: Domenica Reamer, [place] Worker [phone]  Jones Broom. Norval Morton, MSW, [place] 05/01/2014 11:23 AM Evette Cristal, MSW, LCSWA updated 05/01/14)

## 2014-04-24 NOTE — Op Note (Signed)
Procedure: Right common femoral endarterectomy with profundoplasty, vein patch  Preoperative diagnosis: Nonhealing wound right foot  Postoperative diagnosis: Same  Anesthesia: Gen.  Assistant: Gerri Lins PA-C  Operative findings: #1 severe calcific greater than 90% stenosis of the right common femoral profunda and superficial femoral origins #2 vein patch  Operative details: After obtaining informed consent, the patient was taken the operating room. The patient was placed in supine position on the operating table. After induction of general anesthesia and endotracheal intubation, the patient's entire right lower extremity was prepped and draped in usual sterile fashion. Next a longitudinal incision was made in the right groin and carried into the saphenous tissues down to the level of the right common femoral artery at the level of the inguinal ligament. The femoral artery was severely calcified. There was some flow within it. Dissection was carried up underneath the inguinal ligament to the level of the circumflex iliac branches in the artery became softer in quality there but there was still some posterior calcified plaque. I then carried the dissection down the common femoral artery to the origin of the superficial femoral artery. The preputial femoral artery was calcified proximally but after about 2 cm the offered artery became more soft and character. In similar fashion the profunda femoris artery was dissected free circumferentially down to its first branch point. The artery became softer in this location. The origin of the profunda was also severely calcified. There is also one large collateral branch coming off the mid posterior common femoral artery a vessel loop was placed around this as well as a right circumflex iliac artery branch. The patient was given 10,000 units of intravenous heparin. The distal external iliac artery was controlled with a Cooley clamp the superficial femoral and  profunda femoris arteries as well as side branches of the common femoral artery were controlled with vessel loops. After 2 minutes of circulation time opening was made in the common femoral artery. There was a large calcified exophytic plaque which was essentially occluding the origin of the profundus superficial femoral artery and the main portion of the common femoral artery. A suitable endarterectomy plane was obtained and a feathered distal endpoint was obtained in the origin of the superficial femoral artery as well as the profunda femoris artery. I did extend the arteriotomy about 2 cm into the superficial and femoral artery origins to obtain a good endpoint. The distal common femoral artery and up into the distal external iliac artery was also endarterectomized and a good proximal endpoint was obtained. All loose debris was removed from the femoral artery. The origin posterior wall of the superficial femoral artery was tacked down with several 7-0 Prolene sutures. A large tributary vein branch was harvested over several centimeters from the more superficial portion of the groin. This was approximately 3 mm in diameter. This was placed in a reversed configuration and opened longitudinally. It was then sewn on as a patch angioplasty extending from the proximal superficial femoral artery all the way up to the common femoral artery just underneath the inguinal ligament extending onto the external iliac artery. This was done with a running 6-0 Prolene suture. The origin of the profunda femoris artery was closed with several interrupted 7-0 Prolene sutures. Just prior completion anastomosis everything was forebled backbled and thoroughly flushed. Anastomosis was secured clamps released there was good pulsatile flow in the profunda and SFA origin immediately. The patient had good Doppler flow in the dorsalis pedis and posterior tibial arteries. The dorsalis pedis Doppler  signal was more dominant. Next hemostasis was  obtained. The patient had been given an additional 10,000 units of heparin during the course of the case. The patient was given 100 mg of protamine. Thrombin and Gelfoam was applied. A few repair sutures were placed in the patch. Hemostasis was obtained. The inguinal ligament was tacked back down to the posterior portion of the groin with several interrupted 2-0 Vicryl sutures. The groin was then closed in multiple layers of running 2 0 and 3 0 Vicryl suture. The skin was closed with a 4 0 Vicryl subcuticular stitch. Dermabond was applied. The patient tolerated the procedure well and there were no complications. Instrument sponge and needle count was correct at the end of the case. The patient was taken the recovery room in stable condition.  Ruta Hinds, MD [facility] [place] Office: [phone] Pager: [phone]

## 2014-04-25 ENCOUNTER — Other Ambulatory Visit: Payer: Self-pay

## 2014-04-25 ENCOUNTER — Encounter (HOSPITAL_COMMUNITY): Payer: Self-pay | Admitting: Vascular Surgery

## 2014-04-25 DIAGNOSIS — J9611 Chronic respiratory failure with hypoxia: Secondary | ICD-10-CM

## 2014-04-25 DIAGNOSIS — Z9889 Other specified postprocedural states: Secondary | ICD-10-CM

## 2014-04-25 DIAGNOSIS — I471 Supraventricular tachycardia: Secondary | ICD-10-CM

## 2014-04-25 DIAGNOSIS — L03031 Cellulitis of right toe: Secondary | ICD-10-CM

## 2014-04-25 LAB — HEPARIN LEVEL (UNFRACTIONATED)
HEPARIN UNFRACTIONATED: 0.38 [IU]/mL (ref 0.30–0.70)
Heparin Unfractionated: 0.36 IU/mL (ref 0.30–0.70)

## 2014-04-25 LAB — CBC
HEMATOCRIT: 36.1 % (ref 36.0–46.0)
Hemoglobin: 11.5 g/dL — ABNORMAL LOW (ref 12.0–15.0)
MCH: 30.6 pg (ref 26.0–34.0)
MCHC: 31.9 g/dL (ref 30.0–36.0)
MCV: 96 fL (ref 78.0–100.0)
PLATELETS: 409 10*3/uL — AB (ref 150–400)
RBC: 3.76 MIL/uL — AB (ref 3.87–5.11)
RDW: 13.9 % (ref 11.5–15.5)
WBC: 8 10*3/uL (ref 4.0–10.5)

## 2014-04-25 LAB — BASIC METABOLIC PANEL
ANION GAP: 10 (ref 5–15)
BUN: 14 mg/dL (ref 6–23)
CHLORIDE: 103 mmol/L (ref 96–112)
CO2: 26 mmol/L (ref 19–32)
Calcium: 9.2 mg/dL (ref 8.4–10.5)
Creatinine, Ser: 1.13 mg/dL — ABNORMAL HIGH (ref 0.50–1.10)
GFR calc non Af Amer: 43 mL/min — ABNORMAL LOW (ref >90)
GFR, EST AFRICAN AMERICAN: 50 mL/min — AB (ref >90)
Glucose, Bld: 118 mg/dL — ABNORMAL HIGH (ref 70–99)
POTASSIUM: 4.4 mmol/L (ref 3.5–5.1)
SODIUM: 139 mmol/L (ref 135–145)

## 2014-04-25 LAB — GLUCOSE, CAPILLARY
GLUCOSE-CAPILLARY: 132 mg/dL — AB (ref 70–99)
Glucose-Capillary: 145 mg/dL — ABNORMAL HIGH (ref 70–99)
Glucose-Capillary: 138 mg/dL — ABNORMAL HIGH (ref 70–99)
Glucose-Capillary: 190 mg/dL — ABNORMAL HIGH (ref 70–99)
Glucose-Capillary: 165 mg/dL — ABNORMAL HIGH (ref 70–99)

## 2014-04-25 MED ORDER — SODIUM CHLORIDE 0.9 % IV SOLN: 500.0000 mL | Freq: Once | INTRAVENOUS | Status: AC | PRN

## 2014-04-25 MED ORDER — FUROSEMIDE 20 MG PO TABS
20.0000 mg | ORAL_TABLET | ORAL | Status: DC
  Administered 2014-04-25: 20 mg via ORAL
  Filled 2014-04-25: qty 1

## 2014-04-25 MED ORDER — ADENOSINE 6 MG/2ML IV SOLN
INTRAVENOUS | Status: AC
  Administered 2014-04-25: 12 mg
  Filled 2014-04-25: qty 2

## 2014-04-25 MED ORDER — ACETAMINOPHEN 325 MG PO TABS
325.0000 mg | ORAL_TABLET | ORAL | Status: DC | PRN
  Administered 2014-04-29: 650 mg via ORAL
  Filled 2014-04-25 (×2): qty 2

## 2014-04-25 MED ORDER — ONDANSETRON HCL 4 MG/2ML IJ SOLN: 4.0000 mg | Freq: Four times a day (QID) | INTRAMUSCULAR | Status: DC | PRN

## 2014-04-25 MED ORDER — ESCITALOPRAM OXALATE 5 MG PO TABS
5.0000 mg | ORAL_TABLET | Freq: Two times a day (BID) | ORAL | Status: DC
  Administered 2014-04-25 – 2014-05-01 (×13): 5 mg via ORAL
  Filled 2014-04-25 (×17): qty 1

## 2014-04-25 MED ORDER — PANTOPRAZOLE SODIUM 40 MG PO TBEC
40.0000 mg | DELAYED_RELEASE_TABLET | Freq: Every day | ORAL | Status: DC
  Administered 2014-04-25 – 2014-05-01 (×7): 40 mg via ORAL
  Filled 2014-04-25 (×6): qty 1

## 2014-04-25 MED ORDER — IPRATROPIUM-ALBUTEROL 0.5-2.5 (3) MG/3ML IN SOLN
3.0000 mL | Freq: Two times a day (BID) | RESPIRATORY_TRACT | Status: DC | PRN
Start: 2014-04-25 — End: 2014-05-01

## 2014-04-25 MED ORDER — ACETAMINOPHEN 650 MG RE SUPP: 325.0000 mg | RECTAL | Status: DC | PRN

## 2014-04-25 MED ORDER — METOPROLOL TARTRATE 1 MG/ML IV SOLN
5.0000 mg | Freq: Once | INTRAVENOUS | Status: AC
  Administered 2014-04-25: 5 mg via INTRAVENOUS

## 2014-04-25 MED ORDER — LABETALOL HCL 5 MG/ML IV SOLN
10.0000 mg | INTRAVENOUS | Status: DC | PRN
  Filled 2014-04-25: qty 4

## 2014-04-25 MED ORDER — SODIUM CHLORIDE 0.9 % IV BOLUS (SEPSIS)
250.0000 mL | Freq: Once | INTRAVENOUS | Status: AC
  Administered 2014-04-25: 250 mL via INTRAVENOUS

## 2014-04-25 MED ORDER — MAGNESIUM SULFATE 2 GM/50ML IV SOLN
2.0000 g | Freq: Every day | INTRAVENOUS | Status: DC | PRN
  Filled 2014-04-25: qty 50

## 2014-04-25 MED ORDER — BISACODYL 10 MG RE SUPP: 10.0000 mg | Freq: Every day | RECTAL | Status: DC | PRN

## 2014-04-25 MED ORDER — FOLIC ACID 1 MG PO TABS
1.0000 mg | ORAL_TABLET | Freq: Every morning | ORAL | Status: DC
  Administered 2014-04-25 – 2014-05-01 (×7): 1 mg via ORAL
  Filled 2014-04-25 (×7): qty 1

## 2014-04-25 MED ORDER — ALUM & MAG HYDROXIDE-SIMETH 200-200-20 MG/5ML PO SUSP: 15.0000 mL | ORAL | Status: DC | PRN

## 2014-04-25 MED ORDER — METOPROLOL TARTRATE 1 MG/ML IV SOLN: 2.5000 mg | INTRAVENOUS | Status: DC | PRN

## 2014-04-25 MED ORDER — METOPROLOL TARTRATE 25 MG PO TABS
25.0000 mg | ORAL_TABLET | Freq: Four times a day (QID) | ORAL | Status: DC
  Filled 2014-04-25 (×5): qty 1

## 2014-04-25 MED ORDER — DOCUSATE SODIUM 100 MG PO CAPS
100.0000 mg | ORAL_CAPSULE | Freq: Every day | ORAL | Status: DC
  Administered 2014-04-25: 100 mg via ORAL
  Filled 2014-04-25: qty 1

## 2014-04-25 MED ORDER — HEPARIN (PORCINE) IN NACL 100-0.45 UNIT/ML-% IJ SOLN
1500.0000 [IU]/h | INTRAMUSCULAR | Status: AC
  Administered 2014-04-25 – 2014-04-26 (×2): 1500 [IU]/h via INTRAVENOUS
  Filled 2014-04-25 (×4): qty 250

## 2014-04-25 MED ORDER — HYDRALAZINE HCL 20 MG/ML IJ SOLN: 5.0000 mg | INTRAMUSCULAR | Status: DC | PRN

## 2014-04-25 MED ORDER — GUAIFENESIN-DM 100-10 MG/5ML PO SYRP
15.0000 mL | ORAL_SOLUTION | ORAL | Status: DC | PRN
  Filled 2014-04-25: qty 15

## 2014-04-25 MED ORDER — PHENOL 1.4 % MT LIQD
1.0000 | OROMUCOSAL | Status: DC | PRN
  Filled 2014-04-25: qty 177

## 2014-04-25 MED ORDER — SOTALOL HCL 80 MG PO TABS
80.0000 mg | ORAL_TABLET | Freq: Two times a day (BID) | ORAL | Status: DC
  Administered 2014-04-26 – 2014-04-30 (×9): 80 mg via ORAL
  Filled 2014-04-25 (×16): qty 1

## 2014-04-25 MED ORDER — POTASSIUM CHLORIDE CRYS ER 20 MEQ PO TBCR: 20.0000 meq | EXTENDED_RELEASE_TABLET | Freq: Every day | ORAL | Status: DC | PRN

## 2014-04-25 MED ORDER — METOPROLOL TARTRATE 1 MG/ML IV SOLN
2.0000 mg | INTRAVENOUS | Status: AC | PRN
  Administered 2014-04-25: 5 mg via INTRAVENOUS
  Administered 2014-04-25: 2 mg via INTRAVENOUS
  Filled 2014-04-25 (×3): qty 5

## 2014-04-25 MED ORDER — ADENOSINE 6 MG/2ML IV SOLN
INTRAVENOUS | Status: AC
  Administered 2014-04-25: 6 mg
  Filled 2014-04-25: qty 4

## 2014-04-25 NOTE — Progress Notes (Signed)
Utilization review completed.  

## 2014-04-25 NOTE — Progress Notes (Signed)
ANTICOAGULATION CONSULT NOTE - Follow Up Consult  Pharmacy Consult for Heparin  Indication: atrial fibrillation  Allergies  Allergen Reactions  . Hydrocodone Anaphylaxis  . Ace Inhibitors Other (See Comments)  . Ciprofloxacin   . Ketamine Other (See Comments)    halluciantion  . Niaspan [Niacin Er] Other (See Comments)    jitters  . Zetia [Ezetimibe]     Patient Measurements: Height: 5\' 6"  (167.6 cm) Weight: 208 lb 14.4 oz (94.756 kg) IBW/kg (Calculated) : 59.3  Vital Signs: Temp: 97.5 F (36.4 C) (03/18 0045) BP: 139/66 mmHg (03/18 0045) Pulse Rate: 92 (03/18 0045)  Labs:  Recent Labs  04/22/14 0454 04/22/14 0730 04/23/14 0449 04/24/14 0547  HGB 11.7*  --  11.3* 11.4*  HCT 36.5  --  34.4* 34.6*  PLT 404*  --  382 397  HEPARINUNFRC  --  0.31 0.38 0.64  CREATININE 1.09  --  1.07 1.19*    Estimated Creatinine Clearance: 39.4 mL/min (by C-G formula based on Cr of 1.19).  Assessment: 81 y/o F now s/p femoral endarterectomy to resume heparin.   Goal of Therapy:  Heparin level 0.3-0.7 units/ml Monitor platelets by anticoagulation protocol: Yes   Plan:  -Resume heparin at 1500 units/hr -1000 HL -Daily CBC/HL -Monitor for bleeding  Narda Bonds 04/25/2014,1:17 AM

## 2014-04-25 NOTE — Anesthesia Postprocedure Evaluation (Signed)
  Anesthesia Post-op Note  Patient: Julia Goodman  Procedure(s) Performed: Procedure(s): RIGHT FEMORAL ARTERY ENDARTERECTOMY WITH PROFUNDOPLASTY USING VEIN (Right)  Patient Location: PACU  Anesthesia Type:General  Level of Consciousness: awake  Airway and Oxygen Therapy: Patient Spontanous Breathing  Post-op Pain: mild  Post-op Assessment: Post-op Vital signs reviewed  Post-op Vital Signs: Reviewed  Last Vitals:  Filed Vitals:   04/25/14 1500  BP:   Pulse:   Temp: 36.4 C  Resp:     Complications: No apparent anesthesia complications

## 2014-04-25 NOTE — Progress Notes (Signed)
VASCULAR LAB PRELIMINARY  ARTERIAL  ABI completed:    RIGHT    LEFT    PRESSURE WAVEFORM  PRESSURE WAVEFORM  BRACHIAL 136 Triphasic   BRACHIAL 126 Triphasic   DP 116 Monophasic DP 49 Dampened monophasic   AT   AT    PT 67 Monophasic PT 55 Dampened monophasic   PER   PER    GREAT TOE  NA GREAT TOE  NA    RIGHT LEFT  ABI 0.85 0.4     Annel Zunker, RVT 04/25/2014, 3:39 PM

## 2014-04-25 NOTE — Progress Notes (Signed)
Medical Consultation PROGRESS NOTE  ANNETE AYUSO LKG:[id number] DOB: Aug 23, 1925 DOA: 04/17/2014 PCP: Moshe Cipro, MD  HPI: Julia Goodman is a 81 y.o. female who approximately 3 weeks ago injured her right foot on a wheelchair. She developed a blister and wounds on the third and fourth toes of the right foot. She was seen by her podiatrist 3/10. He discussed the case with Dr. Oneida Alar. Dr. Oneida Alar recommended that the [facility]. Patient is admitted to vascular surgery, hospitalist consulted for medical management.  Subjective / 24 H Interval events -overnight event noted, denies sob. Heart rate stable, Daughter in room.   Assessment/Plan: Principal Problem:   Gangrene associated with diabetes mellitus Active Problems:   COPD (chronic obstructive pulmonary disease)   CKD (chronic kidney disease), stage III   Diabetes mellitus   Atrial fibrillation   CAD in native artery   Chronic combined systolic and diastolic HF (heart failure)   PSVT (paroxysmal supraventricular tachycardia)    DM  - controlled with most recent A1C of 6.3 - with renal and vascular complications - continue home medications with glipizide and Tradjenta, SSI - CBGs with good control COPD  - stable - she has mild wheezing likely in the setting of mild fluid overload - Lasix per cardiology, 2D echo with normal EF, LA dilatation - continue xopenex - incentive spirometry  - continue oxygen, she is on chronic O2 CKD stage III - discontinued Relafen, patient has been on this for years, scheduled three times daily - Cr at baseline  Right foot gangrene  - per vascular , s/p Right common femoral endarterectomy with profundoplasty, vein patch History of PAF  - currently in sinus rhythm on telemetry - - oral anticoagulation on hold, continue heprin gtt, per vascular surgery will start coumadin 3/19 -overnight event noted, heart rate stable currently, cardiology restarted sotalol on  6/64  Chronic diastolic/systolic chf, crackles today, on 5liter oxygen currently (on 3liter at home), restart lasix 20mg  qd, monitor volume status.  Lung mass / Malignancy  - followed at [facility] s/p ablation 11/2013.  - outpatient follow up, supposed to have a repeat CT scan in April  We'll continue to follow, thank you for this consult.    Diet: Diet heart healthy/carb modified Fluids: KVO DVT Prophylaxis: heparin gtt  Code Status: Full Code Family Communication: patient and daughter Disposition Plan: remain inpatient  Procedures: Right common femoral endarterectomy with profundoplasty, vein patch 3/17   Antibiotics Vancomycin 3/10 >> Zosyn 3/10 >>   Studies  No results found.  Objective  Filed Vitals:   04/25/14 0830 04/25/14 1200 04/25/14 1500 04/25/14 2022  BP: 85/59 117/53  124/46  Pulse: 128 55  59  Temp:  97.8 F (36.6 C) 97.5 F (36.4 C) 97.8 F (36.6 C)  TempSrc:  Oral Oral Oral  Resp: 17 18  21   Height:      Weight:      SpO2: 95% 98%  92%    Intake/Output Summary (Last 24 hours) at 04/25/14 2048 Last data filed at 04/25/14 2023  Gross per 24 hour  Intake    910 ml  Output    605 ml  Net    305 ml   Filed Weights   04/17/14 2128 04/24/14 0459 04/25/14 0120  Weight: 92.987 kg (205 lb) 94.756 kg (208 lb 14.4 oz) 95.6 kg (210 lb 12.2 oz)   Exam:  General:  NAD  HEENT: no scleral icterus  Cardiovascular: RRR without MRG  Respiratory: no wheezing, overall decreased breath sounds  Abdomen: soft, non tender  MSK/Extremities: right foot wound involving right third and fourth toe with gangrene/cellulitis/erythema/associated edema  Skin: no rashes  Neuro: non focal   Data Reviewed: Basic Metabolic Panel:  Recent Labs Lab 04/21/14 0600 04/22/14 0454 04/23/14 0449 04/24/14 0547 04/25/14 0250  NA 137 139 136 138 139  K 5.7* 5.1 4.6 4.2 4.4  CL 104 105 102 102 103  CO2 16* 26 24 23 26   GLUCOSE 151* 121* 131* 112* 118*  BUN 32* 23  19 15 14   CREATININE 1.22* 1.09 1.07 1.19* 1.13*  CALCIUM 9.0 9.3 9.5 9.3 9.2   Liver Function Tests: No results for input(s): AST, ALT, ALKPHOS, BILITOT, PROT, ALBUMIN in the last 168 hours. CBC:  Recent Labs Lab 04/21/14 0600 04/22/14 0454 04/23/14 0449 04/24/14 0547 04/25/14 0250  WBC 6.1 7.5 6.7 6.0 8.0  HGB 11.5* 11.7* 11.3* 11.4* 11.5*  HCT 35.5* 36.5 34.4* 34.6* 36.1  MCV 95.7 95.3 94.8 95.1 96.0  PLT 320 404* 382 397 409*   CBG:  Recent Labs Lab 04/24/14 1807 04/25/14 0532 04/25/14 0748 04/25/14 1212 04/25/14 1627  GLUCAP 153* 145* 138* 190* 132*    Recent Results (from the past 240 hour(s))  Blood culture (routine x 2)     Status: None   Collection Time: 04/17/14  9:42 PM  Result Value Ref Range Status   Specimen Description BLOOD RIGHT ANTECUBITAL  Final   Special Requests BOTTLES DRAWN AEROBIC AND ANAEROBIC 5CC EACH  Final   Culture   Final    NO GROWTH 5 DAYS Performed at [facility]    Report Status 04/24/2014 FINAL  Final  Blood culture (routine x 2)     Status: None   Collection Time: 04/17/14 11:25 PM  Result Value Ref Range Status   Specimen Description BLOOD LEFT ANTECUBITAL  Final   Special Requests BOTTLES DRAWN AEROBIC AND ANAEROBIC 5CC EACH  Final   Culture   Final    NO GROWTH 5 DAYS Performed at [facility]    Report Status 04/24/2014 FINAL  Final  Urine culture     Status: None   Collection Time: 04/18/14 12:46 AM  Result Value Ref Range Status   Specimen Description URINE, RANDOM  Final   Special Requests NONE  Final   Colony Count NO GROWTH Performed at [facility]   Final   Culture NO GROWTH Performed at [facility]   Final   Report Status 04/19/2014 FINAL  Final     Scheduled Meds: . escitalopram  5 mg Oral BID  . feeding supplement (GLUCERNA SHAKE)  237 mL Oral TID WC  . folic acid  1 mg Oral q morning - 10a  . insulin aspart  0-15 Units Subcutaneous TID WC  . lactobacillus  1 g  Oral TID WC  . linagliptin  5 mg Oral Daily  . pantoprazole  40 mg Oral Daily  . piperacillin-tazobactam (ZOSYN)  IV  3.375 g Intravenous Q8H  . sodium polystyrene  15 g Oral Once  . sotalol  80 mg Oral Q12H  . vancomycin  1,000 mg Intravenous Q24H   Continuous Infusions: . sodium chloride 30 mL/hr at 04/25/14 0700  . heparin 1,500 Units/hr (04/25/14 1600)    Florencia Reasons, MD/PhD [facility] Pager [phone]. If 7 PM - 7 AM, please contact night-coverage at [email or web address], password [facility] 04/25/2014, 8:48 PM  LOS: 7 days

## 2014-04-25 NOTE — Progress Notes (Addendum)
       Patient Name: Julia Goodman Date of Encounter: 04/25/2014    SUBJECTIVE:Had chest pressure and dyspnea during SVT early this AM. The episode lasted several hours. There is no current discomfort or dyspnea. The daughter is also now in the office.  TELEMETRY: Now in Sinus brady. Reviewed the rhythm strips from this AM and appear to be SVT Filed Vitals:   04/25/14 0510 04/25/14 0530 04/25/14 0730 04/25/14 0830  BP: 107/69 100/66 111/70 85/59  Pulse: 123 122 127 128  Temp:   97.8 F (36.6 C)   TempSrc:   Oral   Resp: 16 15 18 17   Height:      Weight:      SpO2: 95% 95% 96% 95%    Intake/Output Summary (Last 24 hours) at 04/25/14 1140 Last data filed at 04/25/14 0900  Gross per 24 hour  Intake   1225 ml  Output    395 ml  Net    830 ml   LABS: Basic Metabolic Panel:  Recent Labs  04/24/14 0547 04/25/14 0250  NA 138 139  K 4.2 4.4  CL 102 103  CO2 23 26  GLUCOSE 112* 118*  BUN 15 14  CREATININE 1.19* 1.13*  CALCIUM 9.3 9.2   CBC:  Recent Labs  04/24/14 0547 04/25/14 0250  WBC 6.0 8.0  HGB 11.4* 11.5*  HCT 34.6* 36.1  MCV 95.1 96.0  PLT 397 409*   Radiology/Studies:  No new data  Physical Exam: Blood pressure 85/59, pulse 128, temperature 97.8 F (36.6 C), temperature source Oral, resp. rate 17, height 5\' 6"  (1.676 m), weight 210 lb 12.2 oz (95.6 kg), SpO2 95 %. Weight change: 1 lb 13.8 oz (0.844 kg)  Wt Readings from Last 3 Encounters:  04/25/14 210 lb 12.2 oz (95.6 kg)  07/11/13 212 lb (96.163 kg)  05/30/13 212 lb (96.163 kg)   Chest is clear anteriorly Cardiac with soft systolic murmur. No edema Ischemic toes bilat.  ASSESSMENT:  1. PSVT last night and this AM finally broke. 2. CAD with angina during the tachycardia, likely related to demand in setting of chronic RCA occlusion 3. Diastolic HF is stable without evidence acute volume overload. 4. PAD s/p surgical and endovascular revasc. 5. Patient was on sotalol as an OP, and not sure  why it was discontinued in the hospital.  Plan:  1. Stop metoprolol 2. Resume usual dose of sotalol 80 mg BID. Need to follow ECG QTc as dose is at upper range for renal function. May be better on 80 mg daily as the ultimate dosing.  Demetrios Isaacs 04/25/2014, 11:40 AM

## 2014-04-25 NOTE — Progress Notes (Signed)
63 RN notified that pt has HR of 130-140 ST. NP paged and responded . [facility] paged.73 Ekg completed.  cardiology respond at bedside. Pt HR sustainted at 130-140 pt c/o pressure in chest. Pt place on Zoll monitor.  [employer] MD order adenosine 6mg  IV push.  HR sustained 130'sST 0413 MD ordered adenosine 12mg  IV push  HR decreased then returned to 130'sST 0415 MD emergently called away from bedside gave Verbal order to give metoprolol 5mg  IV push. HR decresed to 120's-130 ST 0455 MD called and updated on pt's status. Md gave order to give 5mg  IV push  HR sustained 123-127 ST. Pt c/o on increased pain.7/10 in back and left chest wall.  Pain medication give per orders. 0550 Pt resting comfortably. HR 125, BP 108/72 (82) pox 98% on 5 l of o2.. Pain in 2/10.  0620 Daughter Hermenia Fritcher updated on pt's status.Will continue to monitor pt.

## 2014-04-25 NOTE — Progress Notes (Signed)
OT Cancellation Note  Patient Details Name: Julia Goodman MRN: [id number] DOB: 01-22-1926   Cancelled Treatment:    Reason Eval/Treat Not Completed: Medical issues which prohibited therapy Pt with tachycardia at rest. Will hold until Pt medically stable.   Villa Herb M 04/25/2014, 1:15 PM

## 2014-04-25 NOTE — Progress Notes (Signed)
ANTICOAGULATION CONSULT NOTE - Follow Up Consult  Pharmacy Consult for heparin Indication: atrial fibrillation  Allergies  Allergen Reactions  . Hydrocodone Anaphylaxis  . Ace Inhibitors Other (See Comments)  . Ciprofloxacin   . Ketamine Other (See Comments)    halluciantion  . Niaspan [Niacin Er] Other (See Comments)    jitters  . Zetia [Ezetimibe]     Patient Measurements: Height: 5\' 6"  (167.6 cm) Weight: 210 lb 12.2 oz (95.6 kg) IBW/kg (Calculated) : 59.3 Heparin Dosing Weight: 80 kg  Vital Signs: Temp: 97.8 F (36.6 C) (03/18 2022) Temp Source: Oral (03/18 2022) BP: 124/46 mmHg (03/18 2022) Pulse Rate: 59 (03/18 2022)  Labs:  Recent Labs  04/23/14 0449 04/24/14 0547 04/25/14 0250 04/25/14 1010 04/25/14 1922  HGB 11.3* 11.4* 11.5*  --   --   HCT 34.4* 34.6* 36.1  --   --   PLT 382 397 409*  --   --   HEPARINUNFRC 0.38 0.64  --  0.36 0.38  CREATININE 1.07 1.19* 1.13*  --   --     Estimated Creatinine Clearance: 41.6 mL/min (by C-G formula based on Cr of 1.13).   Medications:  Infusions:  . sodium chloride 30 mL/hr at 04/25/14 0700  . heparin 1,500 Units/hr (04/25/14 1600)    Assessment: 81 y/o female on IV heparin for Afib s/p femoral endarterectomy. Confirmatory heparin level is therapeutic at 0.38 on 1500 units/hr. No bleeding noted. She was on apixaban PTA however per [facility] note plan is to start warfarin on 3/19.  Goal of Therapy:  Heparin level 0.3-0.7 units/ml Monitor platelets by anticoagulation protocol: Yes   Plan:  - Continue heparin drip at 1500 units/hr - Daily heparin level and CBC - F/u oral anticoagulation plan  [facility], Pharm.D., BCPS Clinical Pharmacist Pager: [phone] 04/25/2014 9:05 PM

## 2014-04-25 NOTE — Progress Notes (Signed)
Pt heart rate continues to be elevated in the upper 120's to low 130's and on assessment at 0830 pt b/p noted to b3 85/59 (64). Pt is asymptomatic. Dwana Melena PA  called new order received. Will implement and continue to monitor.

## 2014-04-25 NOTE — Progress Notes (Signed)
[facility]  Subjective  - right foot a little sore   Objective 85/59 128 97.8 F (36.6 C) (Oral) 17 95%  Intake/Output Summary (Last 24 hours) at 04/25/14 0944 Last data filed at 04/25/14 0900  Gross per 24 hour  Intake   1225 ml  Output    395 ml  Net    830 ml   Right DP/PT bi to triphasic doppler Right groin incision clean some periincisional ecchymosis no hematoma  Assessment/Planning: Rapid afib: Cardiology addressing, ok to use heparin from my standpoint would wait to restart oral anticoagulation until tomorrow Right femoral endartectomy right foot is now as good as it is going to be hopefully can clear infection.  Would continue antibiotics for at least 2 weeks.  Will leave decision of PO or IV to primary team. Right groin incision will be tenous.  Please keep dry gauze in this to wick away moisture  FIELDS,CHARLES E 04/25/2014 9:44 AM --  Laboratory Lab Results:  Recent Labs  04/24/14 0547 04/25/14 0250  WBC 6.0 8.0  HGB 11.4* 11.5*  HCT 34.6* 36.1  PLT 397 409*   BMET  Recent Labs  04/24/14 0547 04/25/14 0250  NA 138 139  K 4.2 4.4  CL 102 103  CO2 23 26  GLUCOSE 112* 118*  BUN 15 14  CREATININE 1.19* 1.13*  CALCIUM 9.3 9.2    COAG Lab Results  Component Value Date   INR 1.18 04/18/2014   No results found for: PTT

## 2014-04-25 NOTE — Telephone Encounter (Signed)
Received a call back from [place] with Dr Sanjuan Dame office. She requested that I send a clearance form and patients records to her attention at Fax # [phone]. I have faxed those records as requested. She is hopeful that Dr Sabra Heck will be able to clear Julia Goodman from her last OV.  Dpm

## 2014-04-25 NOTE — Progress Notes (Signed)
PT Cancellation Note  Patient Details Name: Julia Goodman MRN: [id number] DOB: [birth year]   Cancelled Treatment:    Reason Eval/Treat Not Completed: Medical issues which prohibited therapy (pt with HR 132 at rest, hypotensive and not medically stable at this time)   Melford Aase 04/25/2014, 8:45 AM Elwyn Reach, [place]

## 2014-04-25 NOTE — Progress Notes (Signed)
ANTICOAGULATION & ANTIBIOTICS CONSULT NOTE - Follow Up Consult  Pharmacy Consult for Heparin; Vancomycin & Zosyn Indication: atrial fibrillation; R-foot gangrene  Allergies  Allergen Reactions  . Hydrocodone Anaphylaxis  . Ace Inhibitors Other (See Comments)  . Ciprofloxacin   . Ketamine Other (See Comments)    halluciantion  . Niaspan [Niacin Er] Other (See Comments)    jitters  . Zetia [Ezetimibe]     Patient Measurements: Height: 5\' 6"  (167.6 cm) Weight: 210 lb 12.2 oz (95.6 kg) IBW/kg (Calculated) : 59.3  Vital Signs: Temp: 97.8 F (36.6 C) (03/18 0730) Temp Source: Oral (03/18 0730) BP: 85/59 mmHg (03/18 0830) Pulse Rate: 128 (03/18 0830)  Labs:  Recent Labs  04/23/14 0449 04/24/14 0547 04/25/14 0250 04/25/14 1010  HGB 11.3* 11.4* 11.5*  --   HCT 34.4* 34.6* 36.1  --   PLT 382 397 409*  --   HEPARINUNFRC 0.38 0.64  --  0.36  CREATININE 1.07 1.19* 1.13*  --     Estimated Creatinine Clearance: 41.6 mL/min (by C-G formula based on Cr of 1.13).  Assessment: 81 y/o F now s/p femoral endarterectomy to resume heparin.   Anticoagulation: Heparin level after resuming at lower rate is therapeutic at 0.36. Hgb is slightly low but stable. Platelets are elevated. No bleeding has been reported. Plan to resume oral anticoagulation tomorrow. Patient was on apixaban 2.5mg  BID prior to admission.   Infectious Disease: 3rd/4th right toe gangrene/cellulitis. WBC wnl. Afebrile. SCr stable. Day 8 of Vanc, day 9 of Zosyn. Cultures are negative. Surgery plans antibiotics for 2 weeks - consider narrowing if able/ oral if possible. Last vancomycin trough on 3/15 was therapeutic.   Goal of Therapy:  Heparin level 0.3-0.7 units/ml Monitor platelets by anticoagulation protocol: Yes  Vancomycin trough 15-20   Plan:  Continue heparin at 1500 units/hr Repeat heparin level in 8 hours to confirm. Daily CBC/HL; Monitor for bleeding Follow-up restart of oral anticoagulation  Continue  Vancomycin 1g IV every 24 hours. Continue Zosyn 3.375g IV every 8 hours - extended infusion. Consider weekly vancomycin trough while on therapy.   MD- please consider ability to narrow therapy/change to oral with negative cultures.   Sloan Leiter, PharmD, BCPS Clinical Pharmacist [phone] 04/25/2014,11:09 AM

## 2014-04-26 DIAGNOSIS — I482 Chronic atrial fibrillation: Secondary | ICD-10-CM

## 2014-04-26 LAB — BASIC METABOLIC PANEL
Anion gap: 7 (ref 5–15)
BUN: 21 mg/dL (ref 6–23)
CHLORIDE: 101 mmol/L (ref 96–112)
CO2: 26 mmol/L (ref 19–32)
Calcium: 8.6 mg/dL (ref 8.4–10.5)
Creatinine, Ser: 1.42 mg/dL — ABNORMAL HIGH (ref 0.50–1.10)
GFR calc non Af Amer: 32 mL/min — ABNORMAL LOW (ref >90)
GFR, EST AFRICAN AMERICAN: 38 mL/min — AB (ref >90)
Glucose, Bld: 134 mg/dL — ABNORMAL HIGH (ref 70–99)
Potassium: 4.3 mmol/L (ref 3.5–5.1)
Sodium: 134 mmol/L — ABNORMAL LOW (ref 135–145)

## 2014-04-26 LAB — CBC
HCT: 32.8 % — ABNORMAL LOW (ref 36.0–46.0)
Hemoglobin: 10.7 g/dL — ABNORMAL LOW (ref 12.0–15.0)
MCH: 31.4 pg (ref 26.0–34.0)
MCHC: 32.6 g/dL (ref 30.0–36.0)
MCV: 96.2 fL (ref 78.0–100.0)
Platelets: 337 10*3/uL (ref 150–400)
RBC: 3.41 MIL/uL — ABNORMAL LOW (ref 3.87–5.11)
RDW: 14 % (ref 11.5–15.5)
WBC: 8.1 10*3/uL (ref 4.0–10.5)

## 2014-04-26 LAB — GLUCOSE, CAPILLARY
GLUCOSE-CAPILLARY: 139 mg/dL — AB (ref 70–99)
GLUCOSE-CAPILLARY: 204 mg/dL — AB (ref 70–99)
GLUCOSE-CAPILLARY: 158 mg/dL — AB (ref 70–99)
Glucose-Capillary: 144 mg/dL — ABNORMAL HIGH (ref 70–99)

## 2014-04-26 LAB — HEPARIN LEVEL (UNFRACTIONATED): Heparin Unfractionated: 0.41 IU/mL (ref 0.30–0.70)

## 2014-04-26 LAB — MAGNESIUM: Magnesium: 1.5 mg/dL (ref 1.5–2.5)

## 2014-04-26 MED ORDER — FUROSEMIDE 10 MG/ML IJ SOLN
INTRAMUSCULAR | Status: AC
  Administered 2014-04-26: 19:00:00
  Filled 2014-04-26: qty 4

## 2014-04-26 MED ORDER — APIXABAN 2.5 MG PO TABS
2.5000 mg | ORAL_TABLET | Freq: Two times a day (BID) | ORAL | Status: DC
  Administered 2014-04-26 – 2014-05-01 (×10): 2.5 mg via ORAL
  Filled 2014-04-26 (×11): qty 1

## 2014-04-26 NOTE — Progress Notes (Signed)
Called report to Jeneen Rinks, RN on [facility]west. Pt's VSS, pain med given, all personal belongings with pt's daughter at bedside. Elink and [place] notified. Pt transferred on monitor to 2w38.

## 2014-04-26 NOTE — Evaluation (Signed)
Physical Therapy Evaluation Patient Details Name: Julia Goodman MRN: [id number] DOB: [birth year] Today's Date: 04/26/2014   History of Present Illness  pt presents post R Femoral Endartectomy with profunoplasty.    Clinical Impression  Pt painful and tearful during mobility.  Pt upset that she is so weak and "helpless".  At this time pt requires 2 person A for OOB mobility.  Feel pt would be best served at [facility] rehab to maximize independence and decrease burden of care prior to returning to home.  Per daughter they would need a SNF in [place] area.  Will continue to follow.      Follow Up Recommendations SNF    Equipment Recommendations  None recommended by PT    Recommendations for Other Services       Precautions / Restrictions Precautions Precautions: Fall Restrictions Weight Bearing Restrictions: No      Mobility  Bed Mobility Overal bed mobility: Needs Assistance Bed Mobility: Sit to Supine       Sit to supine: Mod assist   General bed mobility comments: pt needs A to bring Bil LEs into bed.    Transfers Overall transfer level: Needs assistance Equipment used: 2 person hand held assist Transfers: Sit to/from [facility] Sit to Stand: Mod assist;+2 physical assistance Stand pivot transfers: Mod assist;+2 physical assistance       General transfer comment: cues for UE use and encouragement.  pt tearful at times about being so "helpless".    Ambulation/Gait                Stairs            Wheelchair Mobility    Modified Rankin (Stroke Patients Only)       Balance Overall balance assessment: Needs assistance Sitting-balance support: No upper extremity supported;Feet supported Sitting balance-Leahy Scale: Fair     Standing balance support: During functional activity Standing balance-Leahy Scale: Poor                               Pertinent Vitals/Pain Pain Assessment: Faces Faces Pain Scale: Hurts even  more Pain Location: During mobility R incision Pain Descriptors / Indicators: Burning Pain Intervention(s): Monitored during session;Premedicated before session;Repositioned    Home Living Family/patient expects to be discharged to:: Skilled nursing facility                      Prior Function Level of Independence: Needs assistance   Gait / Transfers Assistance Needed: Uses rollator to Amb short distances and power chair for long distances.    ADL's / Homemaking Assistance Needed: Daughter A with ADLs        Hand Dominance        Extremity/Trunk Assessment   Upper Extremity Assessment: Defer to OT evaluation           Lower Extremity Assessment: Generalized weakness;RLE deficits/detail RLE Deficits / Details: Generally weak, but strength limited by pain.      Cervical / Trunk Assessment: Kyphotic  Communication   Communication: No difficulties  Cognition Arousal/Alertness: Awake/alert Behavior During Therapy: WFL for tasks assessed/performed Overall Cognitive Status: Within Functional Limits for tasks assessed                      General Comments      Exercises        Assessment/Plan    PT Assessment Patient needs continued PT  services  PT Diagnosis Difficulty walking;Generalized weakness   PT Problem List Decreased strength;Decreased activity tolerance;Decreased balance;Decreased mobility;Decreased coordination;Decreased knowledge of use of DME;Obesity;Pain  PT Treatment Interventions DME instruction;Gait training;Functional mobility training;Therapeutic activities;Therapeutic exercise;Balance training;Patient/family education   PT Goals (Current goals can be found in the Care Plan section) Acute Rehab PT Goals Patient Stated Goal: Per daughter for pt to get stronger prior to returning home.   PT Goal Formulation: With patient/family Time For Goal Achievement: 05/10/14 Potential to Achieve Goals: Fair    Frequency Min 2X/week    Barriers to discharge        Co-evaluation               End of Session Equipment Utilized During Treatment: Oxygen Activity Tolerance: Patient limited by fatigue;Patient limited by pain Patient left: in bed;with nursing/sitter in room;with family/visitor present (Nsg to transport pt to new room) Nurse Communication: Mobility status         Time: 7290-2111 PT Time Calculation (min) (ACUTE ONLY): 21 min   Charges:   PT Evaluation $Initial PT Evaluation Tier I: 1 Procedure     PT G CodesCatarina Hartshorn, [place] 04/26/2014, 2:58 PM

## 2014-04-26 NOTE — Progress Notes (Signed)
ANTICOAGULATION CONSULT NOTE - Follow Up Consult  Pharmacy Consult for heparin Indication: atrial fibrillation  Allergies  Allergen Reactions  . Hydrocodone Anaphylaxis  . Ace Inhibitors Other (See Comments)  . Ciprofloxacin   . Ketamine Other (See Comments)    halluciantion  . Niaspan [Niacin Er] Other (See Comments)    jitters  . Zetia [Ezetimibe]     Patient Measurements: Height: 5\' 6"  (167.6 cm) Weight: 215 lb 13.3 oz (97.9 kg) IBW/kg (Calculated) : 59.3 Heparin Dosing Weight: 80 kg  Vital Signs: Temp: 98 F (36.7 C) (03/19 1100) Temp Source: Oral (03/19 1100) BP: 121/51 mmHg (03/19 0800) Pulse Rate: 61 (03/19 1000)  Labs:  Recent Labs  04/24/14 0547 04/25/14 0250 04/25/14 1010 04/25/14 1922 04/26/14 0251  HGB 11.4* 11.5*  --   --  10.7*  HCT 34.6* 36.1  --   --  32.8*  PLT 397 409*  --   --  337  HEPARINUNFRC 0.64  --  0.36 0.38 0.41  CREATININE 1.19* 1.13*  --   --  1.42*    Estimated Creatinine Clearance: 33.5 mL/min (by C-G formula based on Cr of 1.42).   Medications:  Infusions:  . sodium chloride 30 mL/hr at 04/25/14 0700  . heparin 1,500 Units/hr (04/26/14 1136)    Assessment: 81 y/o female on IV heparin for Afib s/p femoral endarterectomy. Heparin level therapeutic x3 on 1500 units/hr. No bleeding noted. She was on apixaban PTA however per [facility] note plan is to start warfarin on 3/19. Primary has not seen yet today, will continue heparin at current rate.  Goal of Therapy:  Heparin level 0.3-0.7 units/ml Monitor platelets by anticoagulation protocol: Yes   Plan:  - Continue heparin drip at 1500 units/hr - Daily heparin level and CBC - F/u oral anticoagulation plan  Megan E. Supple, Pharm.D Clinical Pharmacy Resident Pager: [phone] 04/26/2014 1:15 PM

## 2014-04-26 NOTE — Progress Notes (Signed)
PT Cancellation Note  Patient Details Name: Julia Goodman MRN: [id number] DOB: 08-10-1925   Cancelled Treatment:    Reason Eval/Treat Not Completed: Pain limiting ability to participate.  Will try back another time.     Ares Cardozo, Thornton Papas 04/26/2014, 10:54 AM

## 2014-04-26 NOTE — Progress Notes (Signed)
Medical Consultation PROGRESS NOTE  Julia Goodman WJX:[id number] DOB: August 16, 1925 DOA: 04/17/2014 PCP: Moshe Cipro, MD  HPI: Julia Goodman is a 81 y.o. female who approximately 3 weeks ago injured her right foot on a wheelchair. She developed a blister and wounds on the third and fourth toes of the right foot. She was seen by her podiatrist 3/10. He discussed the case with Dr. Oneida Alar. Dr. Oneida Alar [facility]. Patient is admitted to vascular surgery, hospitalist consulted for medical management.  Subjective / 24 H Interval events -no acute event, feeling better, Heart rate stable, Daughter in room.   Assessment/Plan: Principal Problem:   Gangrene associated with diabetes mellitus Active Problems:   COPD (chronic obstructive pulmonary disease)   CKD (chronic kidney disease), stage III   Diabetes mellitus   Atrial fibrillation   CAD in native artery   Chronic combined systolic and diastolic HF (heart failure)   PSVT (paroxysmal supraventricular tachycardia)   Cellulitis of toe of right foot   Chronic respiratory failure with hypoxia    DM  - controlled with most recent A1C of 6.3 - with renal and vascular complications - continue home medications with glipizide and Tradjenta, SSI - CBGs with good control COPD  - stable - she has mild wheezing likely in the setting of mild fluid overload - Lasix per cardiology, 2D echo with normal EF, LA dilatation - continue xopenex - incentive spirometry  - continue oxygen, she is on chronic O2 CKD stage III - discontinued Relafen, patient has been on this for years, scheduled three times daily - Cr at baseline  Right foot gangrene  - on abx -per vascular , s/p Right common femoral endarterectomy with profundoplasty, vein patch History of PAF  - currently in sinus rhythm on telemetry - - oral anticoagulation on hold, continue heprin gtt, per vascular surgery, start eliquis 3/19 -had a  brief episode of SVT on 3/17night. - heart rate stable currently, sotalol was stopped on 3/14 for unclear reason, cardiology restarted sotalol on 2/13  Chronic diastolic/systolic chf, crackles on 3/18, on 5liter oxygen 3/18 (on 3liter at home), restart lasix 20mg x1 3/18 night, lung exam improved on 3/19 am, lasix d/ed on 3/19 due to cr trending up.   Lung mass / Malignancy  - followed at [facility] s/p ablation 11/2013.  - outpatient follow up, supposed to have a repeat CT scan in April  We'll continue to follow, thank you for this consult.    Diet: Diet heart healthy/carb modified Fluids: KVO DVT Prophylaxis: heparin gtt/ transition to eliquis per pharmacy on 3/19  Code Status: Full Code Family Communication: patient and daughter Disposition Plan: remain inpatient  Procedures: Right common femoral endarterectomy with profundoplasty, vein patch 3/17   Antibiotics Vancomycin 3/10 >> Zosyn 3/10 >>   Studies  No results found.  Objective  Filed Vitals:   04/26/14 1037 04/26/14 1100 04/26/14 1314 04/26/14 1436  BP: 111/93   139/66  Pulse: 65  54 65  Temp:  98 F (36.7 C)  97.6 F (36.4 C)  TempSrc:  Oral  Oral  Resp: 19 19 23 18   Height:      Weight:      SpO2: 93% 84% 88% 95%    Intake/Output Summary (Last 24 hours) at 04/26/14 2034 Last data filed at 04/26/14 1700  Gross per 24 hour  Intake   1335 ml  Output   1403 ml  Net    -68 ml  Filed Weights   04/24/14 0459 04/25/14 0120 04/26/14 0443  Weight: 94.756 kg (208 lb 14.4 oz) 95.6 kg (210 lb 12.2 oz) 97.9 kg (215 lb 13.3 oz)   Exam:  General:  NAD  HEENT: no scleral icterus  Cardiovascular: RRR without MRG  Respiratory: no wheezing, overall decreased breath sounds  Abdomen: soft, non tender  MSK/Extremities: right foot wound less erythema/less edema/ improved peripheral pulse, still tender to palpation.  Skin: no rashes  Neuro: non focal   Data Reviewed: Basic Metabolic Panel:  Recent  Labs Lab 04/22/14 0454 04/23/14 0449 04/24/14 0547 04/25/14 0250 04/26/14 0251  NA 139 136 138 139 134*  K 5.1 4.6 4.2 4.4 4.3  CL 105 102 102 103 101  CO2 26 24 23 26 26   GLUCOSE 121* 131* 112* 118* 134*  BUN 23 19 15 14 21   CREATININE 1.09 1.07 1.19* 1.13* 1.42*  CALCIUM 9.3 9.5 9.3 9.2 8.6  MG  --   --   --   --  1.5   Liver Function Tests: No results for input(s): AST, ALT, ALKPHOS, BILITOT, PROT, ALBUMIN in the last 168 hours. CBC:  Recent Labs Lab 04/22/14 0454 04/23/14 0449 04/24/14 0547 04/25/14 0250 04/26/14 0251  WBC 7.5 6.7 6.0 8.0 8.1  HGB 11.7* 11.3* 11.4* 11.5* 10.7*  HCT 36.5 34.4* 34.6* 36.1 32.8*  MCV 95.3 94.8 95.1 96.0 96.2  PLT 404* 382 397 409* 337   CBG:  Recent Labs Lab 04/25/14 1627 04/25/14 2156 04/26/14 0833 04/26/14 1210 04/26/14 1634  GLUCAP 132* 165* 139* 204* 158*    Recent Results (from the past 240 hour(s))  Blood culture (routine x 2)     Status: None   Collection Time: 04/17/14  9:42 PM  Result Value Ref Range Status   Specimen Description BLOOD RIGHT ANTECUBITAL  Final   Special Requests BOTTLES DRAWN AEROBIC AND ANAEROBIC 5CC EACH  Final   Culture   Final    NO GROWTH 5 DAYS Performed at [facility]    Report Status 04/24/2014 FINAL  Final  Blood culture (routine x 2)     Status: None   Collection Time: 04/17/14 11:25 PM  Result Value Ref Range Status   Specimen Description BLOOD LEFT ANTECUBITAL  Final   Special Requests BOTTLES DRAWN AEROBIC AND ANAEROBIC 5CC EACH  Final   Culture   Final    NO GROWTH 5 DAYS Performed at [facility]    Report Status 04/24/2014 FINAL  Final  Urine culture     Status: None   Collection Time: 04/18/14 12:46 AM  Result Value Ref Range Status   Specimen Description URINE, RANDOM  Final   Special Requests NONE  Final   Colony Count NO GROWTH Performed at [facility]   Final   Culture NO GROWTH Performed at [facility]   Final   Report  Status 04/19/2014 FINAL  Final     Scheduled Meds: . escitalopram  5 mg Oral BID  . feeding supplement (GLUCERNA SHAKE)  237 mL Oral TID WC  . folic acid  1 mg Oral q morning - 10a  . insulin aspart  0-15 Units Subcutaneous TID WC  . lactobacillus  1 g Oral TID WC  . linagliptin  5 mg Oral Daily  . pantoprazole  40 mg Oral Daily  . piperacillin-tazobactam (ZOSYN)  IV  3.375 g Intravenous Q8H  . sodium polystyrene  15 g Oral Once  . sotalol  80 mg  Oral Q12H  . vancomycin  1,000 mg Intravenous Q24H   Continuous Infusions: . sodium chloride 30 mL/hr at 04/25/14 0700  . heparin 1,500 Units/hr (04/26/14 1400)    Julia Reasons, MD/PhD [facility] Pager [phone]. If 7 PM - 7 AM, please contact night-coverage at [email or web address], password [facility] 04/26/2014, 8:34 PM  LOS: 8 days

## 2014-04-26 NOTE — Progress Notes (Signed)
Pt/ family provided with bed offers.  Pt/family prefers [facility] in [place], if bed available, but will pick a [facility] as back-up.  CSW will f/u with the [place] facility and continue to follow for d/c planning.

## 2014-04-26 NOTE — Progress Notes (Signed)
       Patient Name: Julia Goodman Date of Encounter: 04/26/2014    SUBJECTIVE: The patient denies chest pain or SOB.   Filed Vitals:   04/26/14 0939 04/26/14 0952 04/26/14 1000 04/26/14 1100  BP:      Pulse: 60 64 61   Temp:    98 F (36.7 C)  TempSrc:    Oral  Resp: 22 29 17 19   Height:      Weight:      SpO2: 93% 96% 93% 84%    Intake/Output Summary (Last 24 hours) at 04/26/14 1221 Last data filed at 04/26/14 1100  Gross per 24 hour  Intake   1680 ml  Output   1775 ml  Net    -95 ml   MEDICATIONS:   . escitalopram  5 mg Oral BID  . feeding supplement (GLUCERNA SHAKE)  237 mL Oral TID WC  . folic acid  1 mg Oral q morning - 10a  . furosemide  20 mg Oral QODAY  . insulin aspart  0-15 Units Subcutaneous TID WC  . lactobacillus  1 g Oral TID WC  . linagliptin  5 mg Oral Daily  . pantoprazole  40 mg Oral Daily  . piperacillin-tazobactam (ZOSYN)  IV  3.375 g Intravenous Q8H  . sodium polystyrene  15 g Oral Once  . sotalol  80 mg Oral Q12H  . vancomycin  1,000 mg Intravenous Q24H   LABS: Basic Metabolic Panel:  Recent Labs  04/25/14 0250 04/26/14 0251  NA 139 134*  K 4.4 4.3  CL 103 101  CO2 26 26  GLUCOSE 118* 134*  BUN 14 21  CREATININE 1.13* 1.42*  CALCIUM 9.2 8.6  MG  --  1.5   CBC:  Recent Labs  04/25/14 0250 04/26/14 0251  WBC 8.0 8.1  HGB 11.5* 10.7*  HCT 36.1 32.8*  MCV 96.0 96.2  PLT 409* 337   Radiology/Studies:  No new data  Physical Exam: Blood pressure 121/51, pulse 61, temperature 98 F (36.7 C), temperature source Oral, resp. rate 19, height 5\' 6"  (1.676 m), weight 215 lb 13.3 oz (97.9 kg), SpO2 84 %. Weight change: 5 lb 1.1 oz (2.3 kg)  Wt Readings from Last 3 Encounters:  04/26/14 215 lb 13.3 oz (97.9 kg)  07/11/13 212 lb (96.163 kg)  05/30/13 212 lb (96.163 kg)   PE: NAD AAOx3 HEENT: normal, No JVD Chest: CTA Heart: 2/6 systolic murmur LE: no edema   TELEMETRY: personally reviewed, only SR, no  arrhythmias   ASSESSMENT:  1. PSVT - resolved with sotalol 80 mg po BID 2. CAD with angina during the tachycardia, likely related to demand in setting of chronic RCA occlusion, resolved chest pain 3. Diastolic HF is stable without evidence acute volume overload. 4. PAD s/p surgical and endovascular revasc. 5. Patient was on sotalol as an OP, and not sure why it was discontinued in the hospital. 6. Acute on chronic CHF - hold lasix  Signed, Dorothy Spark 04/26/2014, 12:21 PM

## 2014-04-26 NOTE — Progress Notes (Signed)
OT Cancellation Note  Patient Details Name: Julia Goodman MRN: [id number] DOB: [birth year]   Cancelled Treatment:    Reason Eval/Treat Not Completed: Pain limiting ability to participate. Acute OT will follow up as available.   Villa Herb M 04/26/2014, 11:32 AM

## 2014-04-26 NOTE — Progress Notes (Addendum)
  [facility] Progress Note  04/26/2014 9:03 AM 2 Days Post-Op  Subjective:  Having back pain. No chest discomfort or dyspnea.   Tmax 98.9 BP sys 110s-140s HR 60s-70s NSR 02 98% [address]  Filed Vitals:   04/26/14 0827  BP:   Pulse:   Temp: 98.9 F (37.2 C)  Resp:     Physical Exam: Incisions:  Right groin incision clean and intact.  Extremities:  Biphasic right PT doppler signal, dry wounds to right toes.  Cardiac: regular rate Lungs: clear to auscultation, decreased breath sounds   CBC    Component Value Date/Time   WBC 8.1 04/26/2014 0251   RBC 3.41* 04/26/2014 0251   HGB 10.7* 04/26/2014 0251   HCT 32.8* 04/26/2014 0251   PLT 337 04/26/2014 0251   MCV 96.2 04/26/2014 0251   MCH 31.4 04/26/2014 0251   MCHC 32.6 04/26/2014 0251   RDW 14.0 04/26/2014 0251    BMET    Component Value Date/Time   NA 134* 04/26/2014 0251   K 4.3 04/26/2014 0251   CL 101 04/26/2014 0251   CO2 26 04/26/2014 0251   GLUCOSE 134* 04/26/2014 0251   BUN 21 04/26/2014 0251   CREATININE 1.42* 04/26/2014 0251   CALCIUM 8.6 04/26/2014 0251   GFRNONAA 32* 04/26/2014 0251   GFRAA 38* 04/26/2014 0251    INR    Component Value Date/Time   INR 1.18 04/18/2014 0323     Intake/Output Summary (Last 24 hours) at 04/26/14 0903 Last data filed at 04/26/14 5686  Gross per 24 hour  Intake   1725 ml  Output   1625 ml  Net    100 ml     Assessment:  81 y.o. female is s/p: right common femoral endarterectomy with profundoplasty, vein patch 2 Days Post-Op  Plan: -Currently in NSR. Ok to restart oral anticoagulation.  -Right foot wounds stable. Good DP doppler signal. Continue dry gauze to right groin. Meticulous skin hygiene. Continue antibiotics.  -Appreciate medicine service and cardiology following.  -PT and OT eval -Ok to transfer to floor.   Virgina Jock, PA-C [facility] Office: [phone] Pager: [phone] 04/26/2014 9:03 AM     I  have examined the patient, reviewed and agree with above.  Kye Silverstein, MD 04/26/2014 9:12 AM

## 2014-04-27 ENCOUNTER — Inpatient Hospital Stay (HOSPITAL_COMMUNITY): Payer: Medicare Other

## 2014-04-27 LAB — BASIC METABOLIC PANEL
Anion gap: 6 (ref 5–15)
BUN: 19 mg/dL (ref 6–23)
CALCIUM: 8.6 mg/dL (ref 8.4–10.5)
CO2: 25 mmol/L (ref 19–32)
Chloride: 104 mmol/L (ref 96–112)
Creatinine, Ser: 1.26 mg/dL — ABNORMAL HIGH (ref 0.50–1.10)
GFR calc Af Amer: 43 mL/min — ABNORMAL LOW (ref >90)
GFR calc non Af Amer: 37 mL/min — ABNORMAL LOW (ref >90)
Glucose, Bld: 150 mg/dL — ABNORMAL HIGH (ref 70–99)
Potassium: 4 mmol/L (ref 3.5–5.1)
Sodium: 135 mmol/L (ref 135–145)

## 2014-04-27 LAB — GLUCOSE, CAPILLARY
GLUCOSE-CAPILLARY: 154 mg/dL — AB (ref 70–99)
Glucose-Capillary: 123 mg/dL — ABNORMAL HIGH (ref 70–99)
Glucose-Capillary: 178 mg/dL — ABNORMAL HIGH (ref 70–99)
Glucose-Capillary: 149 mg/dL — ABNORMAL HIGH (ref 70–99)

## 2014-04-27 LAB — CBC
HCT: 31.5 % — ABNORMAL LOW (ref 36.0–46.0)
HEMOGLOBIN: 10 g/dL — AB (ref 12.0–15.0)
MCH: 30.7 pg (ref 26.0–34.0)
MCHC: 31.7 g/dL (ref 30.0–36.0)
MCV: 96.6 fL (ref 78.0–100.0)
PLATELETS: 298 10*3/uL (ref 150–400)
RBC: 3.26 MIL/uL — AB (ref 3.87–5.11)
RDW: 14 % (ref 11.5–15.5)
WBC: 6.6 10*3/uL (ref 4.0–10.5)

## 2014-04-27 LAB — T4, FREE: FREE T4: 2.2 ng/dL — AB (ref 0.80–1.80)

## 2014-04-27 MED ORDER — FUROSEMIDE 10 MG/ML IJ SOLN
40.0000 mg | Freq: Once | INTRAMUSCULAR | Status: AC
  Administered 2014-04-28: 40 mg via INTRAVENOUS
  Filled 2014-04-27: qty 4

## 2014-04-27 MED ORDER — DOXYCYCLINE HYCLATE 100 MG PO TABS
100.0000 mg | ORAL_TABLET | Freq: Two times a day (BID) | ORAL | Status: DC
  Administered 2014-04-28 – 2014-04-29 (×4): 100 mg via ORAL
  Filled 2014-04-27 (×5): qty 1

## 2014-04-27 MED ORDER — GUAIFENESIN ER 600 MG PO TB12
600.0000 mg | ORAL_TABLET | Freq: Two times a day (BID) | ORAL | Status: DC
  Administered 2014-04-28 – 2014-05-01 (×8): 600 mg via ORAL
  Filled 2014-04-27 (×9): qty 1

## 2014-04-27 NOTE — Progress Notes (Signed)
Per CSW report from yesterday- family given SNF bed offers in [facility] but are requesting bed at [facility] [place], [place]. Clinical information updated and sent to SNFs in [place], [place] for review. PASARR number in place. Awaiting determination from MD re: stability as well as bed offer/acceptace by family. CSW services will continue to monitor and assist patient and family as indicated. Lorie Phenix. Pauline Good, [place]

## 2014-04-27 NOTE — Progress Notes (Signed)
Medical Consultation PROGRESS NOTE  Julia Goodman NLZ:[id number] DOB: 1925/03/29 DOA: 04/17/2014 PCP: Moshe Cipro, MD  HPI: Julia Goodman is a 81 y.o. female who approximately 3 weeks ago injured her right foot on a wheelchair. She developed a blister and wounds on the third and fourth toes of the right foot. She was seen by her podiatrist 3/10. He discussed the case with Dr. Oneida Alar. Dr. Oneida Alar [facility]. Patient is admitted to vascular surgery, hospitalist consulted for medical management.  Subjective / 24 H Interval events -upon entering to patient room at 9pm, patient is sitting up on the edge of bed, s/o sob, productive cough with white frothy sputum started this pm, also c/o foot swells. Respiratory in room getting ready to give her breathing treatment. Daughter at bedside.  Assessment/Plan: Principal Problem:   Gangrene associated with diabetes mellitus Active Problems:   COPD (chronic obstructive pulmonary disease)   CKD (chronic kidney disease), stage III   Diabetes mellitus   Atrial fibrillation   CAD in native artery   Chronic combined systolic and diastolic HF (heart failure)   PSVT (paroxysmal supraventricular tachycardia)   Cellulitis of toe of right foot   Chronic respiratory failure with hypoxia  Sob/cough/edema: lung exam, some crackles, no wheezing, will get cxr, d/c ivf, lasix40mg  iv x1, change iv abx to oral abx. For now start doxycyline, vascular surgery please advise on abx choice and duration.  Chronic diastolic/systolic chf,  crackles on 3/18, on 5liter oxygen 3/18 (on 3liter at home), restart lasix 20mg x1 3/18 night, lung exam improved on 3/19 am,  lasix d/ed on 3/19 due to cr trending up. C/o sob/cough/edema 3/20 evening, additional lasix given as above.   DM  - controlled with most recent A1C of 6.3 - with renal and vascular complications - continue home medications with glipizide and Tradjenta,  SSI - CBGs with good control  COPD  - stable - she has mild wheezing likely in the setting of mild fluid overload - Lasix per cardiology, 2D echo with normal EF, LA dilatation - continue xopenex - incentive spirometry  - continue oxygen, she is on chronic O2  CKD stage III - discontinued Relafen, patient has been on this for years, scheduled three times daily - Cr at baseline   Right foot gangrene  - on abx -per vascular , s/p Right common femoral endarterectomy with profundoplasty, vein patch  History of PAF  - currently in sinus rhythm on telemetry - - oral anticoagulation on hold, continue heprin gtt, per vascular surgery, start eliquis 3/19 -had a brief episode of SVT on 3/17night. - heart rate stable currently, sotalol was stopped on 3/14 for unclear reason, cardiology restarted sotalol on 3/18   Lung mass / Malignancy  - followed at [facility] s/p ablation 11/2013.  - outpatient follow up, supposed to have a repeat CT scan in April  We'll continue to follow, thank you for this consult.    Diet: Diet heart healthy/carb modified Fluids: KVO DVT Prophylaxis: heparin gtt/ transition to eliquis per pharmacy on 3/19  Code Status: Full Code Family Communication: patient and daughter Disposition Plan: SNF  Procedures: Right common femoral endarterectomy with profundoplasty, vein patch 3/17   Antibiotics Vancomycin 3/10 >>3/20 Zosyn 3/10 >>3/20 Doxycycline from 3/20   Studies  No results found.  Objective  Filed Vitals:   04/26/14 2107 04/27/14 0548 04/27/14 1500 04/27/14 2100  BP: 131/57 134/58 144/52   Pulse: 62 55 54  Temp: 97.9 F (36.6 C) 97.9 F (36.6 C) 98.7 F (37.1 C)   TempSrc: Oral Oral Oral   Resp: 16 16 18    Height:  5\' 6"  (1.676 m)    Weight:  96.9 kg (213 lb 10 oz)    SpO2: 99% 94% 95% 95%    Intake/Output Summary (Last 24 hours) at 04/27/14 2119 Last data filed at 04/27/14 0946  Gross per 24 hour  Intake    240 ml  Output      7 ml    Net    233 ml   Filed Weights   04/25/14 0120 04/26/14 0443 04/27/14 0548  Weight: 95.6 kg (210 lb 12.2 oz) 97.9 kg (215 lb 13.3 oz) 96.9 kg (213 lb 10 oz)   Exam:  General:  NAD  HEENT: no scleral icterus  Cardiovascular: RRR without MRG  Respiratory: mild crackles at bases, no wheezing,   Abdomen: soft, non tender  MSK/Extremities: right foot wound less erythema/less edema/ improved peripheral pulse, still tender to palpation.  Skin: no rashes  Neuro: non focal   Data Reviewed: Basic Metabolic Panel:  Recent Labs Lab 04/23/14 0449 04/24/14 0547 04/25/14 0250 04/26/14 0251 04/27/14 0527  NA 136 138 139 134* 135  K 4.6 4.2 4.4 4.3 4.0  CL 102 102 103 101 104  CO2 24 23 26 26 25   GLUCOSE 131* 112* 118* 134* 150*  BUN 19 15 14 21 19   CREATININE 1.07 1.19* 1.13* 1.42* 1.26*  CALCIUM 9.5 9.3 9.2 8.6 8.6  MG  --   --   --  1.5  --    Liver Function Tests: No results for input(s): AST, ALT, ALKPHOS, BILITOT, PROT, ALBUMIN in the last 168 hours. CBC:  Recent Labs Lab 04/23/14 0449 04/24/14 0547 04/25/14 0250 04/26/14 0251 04/27/14 0527  WBC 6.7 6.0 8.0 8.1 6.6  HGB 11.3* 11.4* 11.5* 10.7* 10.0*  HCT 34.4* 34.6* 36.1 32.8* 31.5*  MCV 94.8 95.1 96.0 96.2 96.6  PLT 382 397 409* 337 298   CBG:  Recent Labs Lab 04/26/14 1634 04/26/14 2204 04/27/14 0617 04/27/14 1205 04/27/14 1648  GLUCAP 158* 144* 123* 178* 149*    Recent Results (from the past 240 hour(s))  Blood culture (routine x 2)     Status: None   Collection Time: 04/17/14  9:42 PM  Result Value Ref Range Status   Specimen Description BLOOD RIGHT ANTECUBITAL  Final   Special Requests BOTTLES DRAWN AEROBIC AND ANAEROBIC 5CC EACH  Final   Culture   Final    NO GROWTH 5 DAYS Performed at [facility]    Report Status 04/24/2014 FINAL  Final  Blood culture (routine x 2)     Status: None   Collection Time: 04/17/14 11:25 PM  Result Value Ref Range Status   Specimen Description  BLOOD LEFT ANTECUBITAL  Final   Special Requests BOTTLES DRAWN AEROBIC AND ANAEROBIC 5CC EACH  Final   Culture   Final    NO GROWTH 5 DAYS Performed at [facility]    Report Status 04/24/2014 FINAL  Final  Urine culture     Status: None   Collection Time: 04/18/14 12:46 AM  Result Value Ref Range Status   Specimen Description URINE, RANDOM  Final   Special Requests NONE  Final   Colony Count NO GROWTH Performed at [facility]   Final   Culture NO GROWTH Performed at [facility]   Final   Report Status 04/19/2014  FINAL  Final     Scheduled Meds: . apixaban  2.5 mg Oral BID  . doxycycline  100 mg Oral Q12H  . escitalopram  5 mg Oral BID  . feeding supplement (GLUCERNA SHAKE)  237 mL Oral TID WC  . folic acid  1 mg Oral q morning - 10a  . furosemide  40 mg Intravenous Once  . guaiFENesin  600 mg Oral BID  . insulin aspart  0-15 Units Subcutaneous TID WC  . lactobacillus  1 g Oral TID WC  . linagliptin  5 mg Oral Daily  . pantoprazole  40 mg Oral Daily  . sodium polystyrene  15 g Oral Once  . sotalol  80 mg Oral Q12H   Continuous Infusions:    Julia Reasons, MD/PhD [facility] Pager [phone]. If 7 PM - 7 AM, please contact night-coverage at [email or web address], password [facility] 04/27/2014, 9:19 PM  LOS: 9 days

## 2014-04-27 NOTE — Progress Notes (Addendum)
  [facility] Progress Note  04/27/2014 7:56 AM 3 Days Post-Op  Subjective:  Doing well today. Still feels weak.    Filed Vitals:   04/27/14 0548  BP: 134/58  Pulse: 55  Temp: 97.9 F (36.6 C)  Resp: 16    Physical Exam: Incisions:  Right groin healing well.  Extremities:  Palpable right DP pulse. Dry eschar and ischemic changes to right toes stable.   CBC    Component Value Date/Time   WBC 6.6 04/27/2014 0527   RBC 3.26* 04/27/2014 0527   HGB 10.0* 04/27/2014 0527   HCT 31.5* 04/27/2014 0527   PLT 298 04/27/2014 0527   MCV 96.6 04/27/2014 0527   MCH 30.7 04/27/2014 0527   MCHC 31.7 04/27/2014 0527   RDW 14.0 04/27/2014 0527    BMET    Component Value Date/Time   NA 135 04/27/2014 0527   K 4.0 04/27/2014 0527   CL 104 04/27/2014 0527   CO2 25 04/27/2014 0527   GLUCOSE 150* 04/27/2014 0527   BUN 19 04/27/2014 0527   CREATININE 1.26* 04/27/2014 0527   CALCIUM 8.6 04/27/2014 0527   GFRNONAA 37* 04/27/2014 0527   GFRAA 43* 04/27/2014 0527    INR    Component Value Date/Time   INR 1.18 04/18/2014 0323     Intake/Output Summary (Last 24 hours) at 04/27/14 0756 Last data filed at 04/27/14 0316  Gross per 24 hour  Intake    645 ml  Output    833 ml  Net   -188 ml     Assessment:  81 y.o. female is s/p: right common femoral endarterectomy with profundoplasty, vein patch 3 Days Post-Op  Plan: -Right foot with palpable DP pulse, incisions healing well. Toe wounds are stable. Continue current wound care -Hx PAF: currently in sinus rhythm. Eliquis restarted yesterday. -Still very deconditioned. Will need to get OOB and mobilize. Plan for SNF. -CSW working on SNF placement in [place]. -Stable from vascular standpoint. Plan for discharge to SNF when cleared by cardiology and medical services.    Virgina Jock, PA-C [facility] Office: [phone] Pager: [phone] 04/27/2014 7:56 AM     I have examined  the patient, reviewed and agree with above. Still with foot pain. OK from vasc standpoint to return to SNF tomorrow  Dajahnae Vondra, MD 04/27/2014 8:36 AM

## 2014-04-27 NOTE — Progress Notes (Signed)
OT Cancellation Note  Patient Details Name: Julia Goodman MRN: [id number] DOB: 10/19/1925   Cancelled Treatment:    Reason Eval/Treat Not Completed: Other (comment) Pt has Medicare and current D/C plan is SNF. No apparent immediate acute care OT needs, therefore will defer OT to SNF. If OT eval is needed please call Acute Rehab Dept. at [phone] or text page OT at [phone].    Villa Herb M 04/27/2014, 7:35 AM

## 2014-04-28 LAB — GLUCOSE, CAPILLARY
GLUCOSE-CAPILLARY: 123 mg/dL — AB (ref 70–99)
GLUCOSE-CAPILLARY: 155 mg/dL — AB (ref 70–99)
Glucose-Capillary: 143 mg/dL — ABNORMAL HIGH (ref 70–99)
Glucose-Capillary: 209 mg/dL — ABNORMAL HIGH (ref 70–99)

## 2014-04-28 LAB — COMPREHENSIVE METABOLIC PANEL
ALT: 23 U/L (ref 0–35)
AST: 22 U/L (ref 0–37)
Albumin: 2.4 g/dL — ABNORMAL LOW (ref 3.5–5.2)
Alkaline Phosphatase: 63 U/L (ref 39–117)
Anion gap: 5 (ref 5–15)
BILIRUBIN TOTAL: 0.5 mg/dL (ref 0.3–1.2)
BUN: 24 mg/dL — ABNORMAL HIGH (ref 6–23)
CALCIUM: 8.8 mg/dL (ref 8.4–10.5)
CHLORIDE: 101 mmol/L (ref 96–112)
CO2: 29 mmol/L (ref 19–32)
Creatinine, Ser: 1.26 mg/dL — ABNORMAL HIGH (ref 0.50–1.10)
GFR calc non Af Amer: 37 mL/min — ABNORMAL LOW (ref >90)
GFR, EST AFRICAN AMERICAN: 43 mL/min — AB (ref >90)
GLUCOSE: 160 mg/dL — AB (ref 70–99)
Potassium: 4.1 mmol/L (ref 3.5–5.1)
Sodium: 135 mmol/L (ref 135–145)
Total Protein: 6.5 g/dL (ref 6.0–8.3)

## 2014-04-28 LAB — CBC
HCT: 33.6 % — ABNORMAL LOW (ref 36.0–46.0)
Hemoglobin: 10.9 g/dL — ABNORMAL LOW (ref 12.0–15.0)
MCH: 31.3 pg (ref 26.0–34.0)
MCHC: 32.4 g/dL (ref 30.0–36.0)
MCV: 96.6 fL (ref 78.0–100.0)
PLATELETS: 335 10*3/uL (ref 150–400)
RBC: 3.48 MIL/uL — ABNORMAL LOW (ref 3.87–5.11)
RDW: 14.1 % (ref 11.5–15.5)
WBC: 7.1 10*3/uL (ref 4.0–10.5)

## 2014-04-28 LAB — MAGNESIUM: Magnesium: 1.6 mg/dL (ref 1.5–2.5)

## 2014-04-28 MED ORDER — FUROSEMIDE 10 MG/ML IJ SOLN: 40.0000 mg | Freq: Every day | INTRAMUSCULAR | Status: DC

## 2014-04-28 MED ORDER — MAGNESIUM SULFATE 2 GM/50ML IV SOLN
2.0000 g | Freq: Once | INTRAVENOUS | Status: AC
  Administered 2014-04-28: 2 g via INTRAVENOUS
  Filled 2014-04-28 (×2): qty 50

## 2014-04-28 MED ORDER — FUROSEMIDE 10 MG/ML IJ SOLN
40.0000 mg | Freq: Two times a day (BID) | INTRAMUSCULAR | Status: DC
  Administered 2014-04-28: 40 mg via INTRAVENOUS
  Filled 2014-04-28 (×3): qty 4

## 2014-04-28 NOTE — Progress Notes (Addendum)
Medical Consultation PROGRESS NOTE  Julia Goodman KGY:[id number] DOB: Apr 13, 1925 DOA: 04/17/2014 PCP: Moshe Cipro, MD  HPI: Julia Goodman is a 81 y.o. female who approximately 3 weeks ago injured her right foot on a wheelchair. She developed a blister and wounds on the third and fourth toes of the right foot. She was seen by her podiatrist 3/10. He discussed the case with Dr. Oneida Alar. Dr. Oneida Alar recommended that the [facility] come [facility]. Patient is admitted to vascular surgery, hospitalist consulted for medical management.  Subjective / 24 H Interval events -orthopnea resolved, breathing better, less cough, does reported frequent loose stool in small quantity x5 last 24hrs. Daughter at bedside.  Assessment/Plan: Principal Problem:   Gangrene associated with diabetes mellitus Active Problems:   COPD (chronic obstructive pulmonary disease)   CKD (chronic kidney disease), stage III   Diabetes mellitus   Atrial fibrillation   CAD in native artery   Chronic combined systolic and diastolic HF (heart failure)   PSVT (paroxysmal supraventricular tachycardia)   Cellulitis of toe of right foot   Chronic respiratory failure with hypoxia  Frequent loose stool, no fever, no leukocytosis, no ab pain, but will check c diff.  Chronic diastolic/systolic chf,  crackles on 3/18, on 5liter oxygen 3/18 (on 3liter at home), restart lasix 20mg x1 3/18 night, lung exam improved on 3/19 am,  lasix d/ed on 3/19 due to cr trending up. C/o sob/cough/edema 3/20 evening, additional lasix given as above. Sob/cough/edema 3/20 evening:   CXR+ pulmonary edema. Ivf d/ced lasix40mg  iv x1, change iv abx to oral abx to doxycyline 3/21 am better, still some crackles, continue iv lasix 40qd.replace mag.  DM  - controlled with most recent A1C of 6.3 - with renal and vascular complications - continue home medications with glipizide and Tradjenta, SSI - CBGs with good control  COPD  - stable -  she has mild wheezing likely in the setting of mild fluid overload - Lasix per cardiology, 2D echo with normal EF, LA dilatation - continue xopenex - incentive spirometry  - continue oxygen, she is on chronic O2  CKD stage III - discontinued Relafen, patient has been on this for years, scheduled three times daily - Cr at baseline   Right foot gangrene /cellulitis - vanc/zosyn d/ced on 3/20, doxycycline started on 3/20 -per vascular , s/p Right common femoral endarterectomy with profundoplasty, vein patch  History of PAF  - currently in sinus rhythm on telemetry - - oral anticoagulation on hold, continue heprin gtt, per vascular surgery, restarted eliquis 3/19 -had a brief episode of SVT on 3/17night. - heart rate stable currently, sotalol was stopped on 3/14 for unclear reason, cardiology restarted sotalol on 3/18   Lung mass / Malignancy  - followed at [facility] s/p ablation 11/2013.  - outpatient follow up, supposed to have a repeat CT scan in April  We'll continue to follow, thank you for this consult.    Diet: Diet heart healthy/carb modified Fluids: KVO DVT Prophylaxis: heparin gtt/ transition to eliquis per pharmacy on 3/19  Code Status: Full Code Family Communication: patient and daughter Disposition Plan: SNF  Procedures: Right common femoral endarterectomy with profundoplasty, vein patch 3/17   Antibiotics Vancomycin 3/10 >>3/20 Zosyn 3/10 >>3/20 Doxycycline from 3/20   Studies  Dg Chest Port 1 View  04/28/2014   CLINICAL DATA:  Acute onset of cough.  Subsequent encounter.  EXAM: PORTABLE CHEST - 1 VIEW  COMPARISON:  Chest radiograph performed 04/22/2014  FINDINGS: The lungs  are well-aerated. Vascular congestion is again noted, with mild bibasilar opacities and small bilateral pleural effusions. This is similar in appearance to the prior study, though effusions are slightly larger. No pneumothorax is identified.  The cardiomediastinal silhouette is borderline  normal in size. No acute osseous abnormalities are seen.  IMPRESSION: Vascular congestion again noted, with mild bibasilar opacities and small bilateral pleural effusions. This is compatible with pulmonary edema and is relatively similar to the prior study, though effusions are slightly larger.   Electronically Signed   By: Garald Balding M.D.   On: 04/28/2014 00:41    Objective  Filed Vitals:   04/27/14 2100 04/27/14 2226 04/28/14 0441 04/28/14 0810  BP:  124/67 123/77   Pulse:  65 47   Temp:  98.5 F (36.9 C) 97.6 F (36.4 C)   TempSrc:  Oral Oral   Resp:  18 16   Height:    5\' 6"  (1.676 m)  Weight:    96.7 kg (213 lb 3 oz)  SpO2: 95% 95% 100%     Intake/Output Summary (Last 24 hours) at 04/28/14 0854 Last data filed at 04/28/14 0438  Gross per 24 hour  Intake    240 ml  Output      5 ml  Net    235 ml   Filed Weights   04/26/14 0443 04/27/14 0548 04/28/14 0810  Weight: 97.9 kg (215 lb 13.3 oz) 96.9 kg (213 lb 10 oz) 96.7 kg (213 lb 3 oz)   Exam:  General:  NAD  HEENT: no scleral icterus  Cardiovascular: RRR without MRG  Respiratory: mild crackles at bases, no wheezing,   Abdomen: soft, non tender  MSK/Extremities: right foot wound less erythema/less edema/ improved peripheral pulse, still tender to palpation.  Skin: no rashes  Neuro: non focal   Data Reviewed: Basic Metabolic Panel:  Recent Labs Lab 04/24/14 0547 04/25/14 0250 04/26/14 0251 04/27/14 0527 04/27/14 2350 04/28/14 0507  NA 138 139 134* 135 135  --   K 4.2 4.4 4.3 4.0 4.1  --   CL 102 103 101 104 101  --   CO2 23 26 26 25 29   --   GLUCOSE 112* 118* 134* 150* 160*  --   BUN 15 14 21 19  24*  --   CREATININE 1.19* 1.13* 1.42* 1.26* 1.26*  --   CALCIUM 9.3 9.2 8.6 8.6 8.8  --   MG  --   --  1.5  --   --  1.6   Liver Function Tests:  Recent Labs Lab 04/27/14 2350  AST 22  ALT 23  ALKPHOS 63  BILITOT 0.5  PROT 6.5  ALBUMIN 2.4*   CBC:  Recent Labs Lab 04/24/14 0547  04/25/14 0250 04/26/14 0251 04/27/14 0527 04/28/14 0507  WBC 6.0 8.0 8.1 6.6 7.1  HGB 11.4* 11.5* 10.7* 10.0* 10.9*  HCT 34.6* 36.1 32.8* 31.5* 33.6*  MCV 95.1 96.0 96.2 96.6 96.6  PLT 397 409* 337 298 335   CBG:  Recent Labs Lab 04/26/14 2204 04/27/14 0617 04/27/14 1205 04/27/14 1648 04/27/14 2225  GLUCAP 144* 123* 178* 149* 154*    No results found for this or any previous visit (from the past 240 hour(s)).   Scheduled Meds: . apixaban  2.5 mg Oral BID  . doxycycline  100 mg Oral Q12H  . escitalopram  5 mg Oral BID  . feeding supplement (GLUCERNA SHAKE)  237 mL Oral TID WC  . folic acid  1 mg Oral q  morning - 10a  . furosemide  40 mg Intravenous Daily  . guaiFENesin  600 mg Oral BID  . insulin aspart  0-15 Units Subcutaneous TID WC  . lactobacillus  1 g Oral TID WC  . linagliptin  5 mg Oral Daily  . magnesium sulfate 1 - 4 g bolus IVPB  2 g Intravenous Once  . pantoprazole  40 mg Oral Daily  . sodium polystyrene  15 g Oral Once  . sotalol  80 mg Oral Q12H   Continuous Infusions:    Florencia Reasons, MD/PhD [facility] Pager [phone]. If 7 PM - 7 AM, please contact night-coverage at [email or web address], password [facility] 04/28/2014, 8:54 AM  LOS: 10 days

## 2014-04-28 NOTE — Progress Notes (Signed)
Patient Name: Julia Goodman Date of Encounter: 04/28/2014  Principal Problem:   Gangrene associated with diabetes mellitus Active Problems:   COPD (chronic obstructive pulmonary disease)   CKD (chronic kidney disease), stage III   Diabetes mellitus   Atrial fibrillation   CAD in native artery   Chronic combined systolic and diastolic HF (heart failure)   PSVT (paroxysmal supraventricular tachycardia)   Cellulitis of toe of right foot   Chronic respiratory failure with hypoxia   Primary Cardiologist: Dr Sabra Heck at [place], Dr Mare Ferrari saw here  Patient Profile: 81 yo female w/ hx DM, COPD on home O2, CAD w/ RCA 100%, PAD,SVT, D-CHF, PAF on Eliquis, CKD, was admitted 03/11 w/ gangrene R foot, s/p R iliac stent 03/14, R fem endarterectomy and profundoplasty 03/17. Cards following for CHF, SVT.  SUBJECTIVE: Still having shooting and burning pain in her foot. No chest pain. Activity very limited, on O2 at baseline  OBJECTIVE Filed Vitals:   04/27/14 2100 04/27/14 2226 04/28/14 0441 04/28/14 0810  BP:  124/67 123/77   Pulse:  65 47   Temp:  98.5 F (36.9 C) 97.6 F (36.4 C)   TempSrc:  Oral Oral   Resp:  18 16   Height:    5\' 6"  (1.676 m)  Weight:    213 lb 3 oz (96.7 kg)  SpO2: 95% 95% 100%     Intake/Output Summary (Last 24 hours) at 04/28/14 0942 Last data filed at 04/28/14 0438  Gross per 24 hour  Intake    240 ml  Output      5 ml  Net    235 ml   Filed Weights   04/26/14 0443 04/27/14 0548 04/28/14 0810  Weight: 215 lb 13.3 oz (97.9 kg) 213 lb 10 oz (96.9 kg) 213 lb 3 oz (96.7 kg)    PHYSICAL EXAM General: Well developed, well nourished, female in no acute distress. Head: Normocephalic, atraumatic.  Neck: Supple without bruits, JVD. Lungs:  Resp regular and unlabored, CTA. Heart: RRR, S1, S2, no S3, S4, or murmur; no rub. Abdomen: Soft, non-tender, non-distended, BS + x 4.  Extremities: No clubbing, cyanosis, edema.  Neuro: Alert and oriented X 3. Moves  all extremities spontaneously. Psych: Normal affect.  LABS: CBC: Recent Labs  04/27/14 0527 04/28/14 0507  WBC 6.6 7.1  HGB 10.0* 10.9*  HCT 31.5* 33.6*  MCV 96.6 96.6  PLT 298 335   INR:No results for input(s): INR in the last 72 hours. Basic Metabolic Panel: Recent Labs  04/26/14 0251 04/27/14 0527 04/27/14 2350 04/28/14 0507  NA 134* 135 135  --   K 4.3 4.0 4.1  --   CL 101 104 101  --   CO2 26 25 29   --   GLUCOSE 134* 150* 160*  --   BUN 21 19 24*  --   CREATININE 1.42* 1.26* 1.26*  --   CALCIUM 8.6 8.6 8.8  --   MG 1.5  --   --  1.6   Liver Function Tests: Recent Labs  04/27/14 2350  AST 22  ALT 23  ALKPHOS 63  BILITOT 0.5  PROT 6.5  ALBUMIN 2.4*   BNP:  B NATRIURETIC PEPTIDE  Date/Time Value Ref Range Status  04/22/2014 03:50 PM 391.9* 0.0 - 100.0 pg/mL Final   TELE: SR, sinus brady, high 40s at times, no SVT, NSVT       Radiology/Studies: Dg Chest Port 1 View 04/28/2014   CLINICAL DATA:  Acute  onset of cough.  Subsequent encounter.  EXAM: PORTABLE CHEST - 1 VIEW  COMPARISON:  Chest radiograph performed 04/22/2014  FINDINGS: The lungs are well-aerated. Vascular congestion is again noted, with mild bibasilar opacities and small bilateral pleural effusions. This is similar in appearance to the prior study, though effusions are slightly larger. No pneumothorax is identified.  The cardiomediastinal silhouette is borderline normal in size. No acute osseous abnormalities are seen.  IMPRESSION: Vascular congestion again noted, with mild bibasilar opacities and small bilateral pleural effusions. This is compatible with pulmonary edema and is relatively similar to the prior study, though effusions are slightly larger.   Electronically Signed   By: Garald Balding M.D.   On: 04/28/2014 00:41    Current Medications:  . apixaban  2.5 mg Oral BID  . doxycycline  100 mg Oral Q12H  . escitalopram  5 mg Oral BID  . feeding supplement (GLUCERNA SHAKE)  237 mL Oral TID  WC  . folic acid  1 mg Oral q morning - 10a  . furosemide  40 mg Intravenous Daily  . guaiFENesin  600 mg Oral BID  . insulin aspart  0-15 Units Subcutaneous TID WC  . lactobacillus  1 g Oral TID WC  . linagliptin  5 mg Oral Daily  . magnesium sulfate 1 - 4 g bolus IVPB  2 g Intravenous Once  . pantoprazole  40 mg Oral Daily  . sodium polystyrene  15 g Oral Once  . sotalol  80 mg Oral Q12H      ASSESSMENT AND PLAN:  Chronic combined systolic and diastolic HF (heart failure) - weight is stable but has LE edema and I/O are positive by almost 5 L since admit (though woefully inaccurate) - has been started on IV Lasix at 40 mg qd, MD advise on increasing to 40 mg IV BID - follow daily BMET while on IV Lasix    Atrial fibrillation - maintaining SR on sotalol - anticoagulated w/ Eliquis    CAD in native artery - no ischemic symptoms  - no ASA w/ apixaban - no BB w/ bradycardia (other than sotalol) - Was on something for cholesterol as OP, can't remember the name - MD advise on starting rx while here, resume home med at d/c    PSVT (paroxysmal supraventricular tachycardia)  - none in >24 hr on tele review  Otherwise, per IM Principal Problem:   Gangrene associated with diabetes mellitus Active Problems:   COPD (chronic obstructive pulmonary disease)   CKD (chronic kidney disease), stage III   Diabetes mellitus   Cellulitis of toe of right foot   Chronic respiratory failure with hypoxia  Signed, Rosaria Ferries , PA-C 9:42 AM 04/28/2014  Patient seen, examined. Available data reviewed. Agree with findings, assessment, and plan as outlined by Rosaria Ferries, PA-C. Patient independently interviewed and examined. I have reviewed her hospital records carefully. The patient is anticoagulated now with Eliquis. I agree with increasing her IV diuresis that she does have evidence of volume overload with diffuse edema and her weight is increased since admission. Renal function should be  followed closely. Will follow along with you.  Sherren Mocha, M.D. 04/28/2014 12:14 PM

## 2014-04-28 NOTE — Clinical Social Work Note (Signed)
CSW contacted patient's daughter Shirlean Mylar to discuss bed offers.  Patient would like to go to [place], sent updated clinicals to facility, first choice is [facility], second choice is [place], and third choice is [facility] in [place] or [place].  Patient to discharge to SNF once she is medically ready and discharge orders have been received.  Jones Broom. [place], MSW, [place] 04/28/2014 5:57 PM

## 2014-04-28 NOTE — Progress Notes (Signed)
Utilization review completed.  

## 2014-04-28 NOTE — Progress Notes (Signed)
Pt had passed stool earlier but it was mixed with urine.  Will continue to look for opportunity to collect cdiff speciment.

## 2014-04-28 NOTE — Progress Notes (Addendum)
  Progress Note    04/28/2014 8:12 AM 4 Days Post-Op  Subjective:  States she has not had any pain this morning.  Still hesitant to put her foot down on the floor as it has hurt for a long time.  Afebrile x 24 hrs VSS  ABx:  Zosyn   Filed Vitals:   04/28/14 0441  BP: 123/77  Pulse: 47  Temp: 97.6 F (36.4 C)  Resp: 16    Physical Exam: Lungs:  Non labored Incisions:  Right groin incision is c/d/i-does have dry gauze in groin to protect incision. Extremities:  2+ palpable right DP   CBC    Component Value Date/Time   WBC 7.1 04/28/2014 0507   RBC 3.48* 04/28/2014 0507   HGB 10.9* 04/28/2014 0507   HCT 33.6* 04/28/2014 0507   PLT 335 04/28/2014 0507   MCV 96.6 04/28/2014 0507   MCH 31.3 04/28/2014 0507   MCHC 32.4 04/28/2014 0507   RDW 14.1 04/28/2014 0507    BMET    Component Value Date/Time   NA 135 04/27/2014 2350   K 4.1 04/27/2014 2350   CL 101 04/27/2014 2350   CO2 29 04/27/2014 2350   GLUCOSE 160* 04/27/2014 2350   BUN 24* 04/27/2014 2350   CREATININE 1.26* 04/27/2014 2350   CALCIUM 8.8 04/27/2014 2350   GFRNONAA 37* 04/27/2014 2350   GFRAA 43* 04/27/2014 2350    INR    Component Value Date/Time   INR 1.18 04/18/2014 0323     Intake/Output Summary (Last 24 hours) at 04/28/14 3833 Last data filed at 04/28/14 0438  Gross per 24 hour  Intake    240 ml  Output      5 ml  Net    235 ml     Assessment:  81 y.o. female is s/p:  Right common femoral endarterectomy with profundoplasty, vein patch  4 Days Post-Op And Aortogram with right lower extremity runoff, right external iliac stent (self-expanding 7 x 30 Abbott) 7 Days Post-Op   Plan: -pt with palpable right DP; toes on right foot are starting to dry up  -DVT prophylaxis:  Eliquis -Abx:  Vanc/zosyn and now oral doxycycline for pulmonary issues.  Spoke with Dr. Donnetta Hutching and he does not feel she needs ABx for her toes and they are looking better. -PT to see pt as she has not  mobilized much at all -appreciate cardiology and IM input for this pt   Leontine Locket, PA-C [facility] [phone] 04/28/2014 8:12 AM    I have examined the patient, reviewed and agree with above.Pain cont to improve  Shaquela Weichert, MD 04/28/2014 2:43 PM

## 2014-04-28 NOTE — Progress Notes (Signed)
Medicare Important Message given? YES  (If response is "NO", the following Medicare IM given date fields will be blank)  Date Medicare IM given: 04/28/14 Medicare IM given by:  Sharlotte Baka  

## 2014-04-29 LAB — CBC
HEMATOCRIT: 34.8 % — AB (ref 36.0–46.0)
HEMOGLOBIN: 11.5 g/dL — AB (ref 12.0–15.0)
MCH: 31.2 pg (ref 26.0–34.0)
MCHC: 33 g/dL (ref 30.0–36.0)
MCV: 94.3 fL (ref 78.0–100.0)
Platelets: 344 10*3/uL (ref 150–400)
RBC: 3.69 MIL/uL — ABNORMAL LOW (ref 3.87–5.11)
RDW: 13.8 % (ref 11.5–15.5)
WBC: 6.7 10*3/uL (ref 4.0–10.5)

## 2014-04-29 LAB — GLUCOSE, CAPILLARY
GLUCOSE-CAPILLARY: 134 mg/dL — AB (ref 70–99)
Glucose-Capillary: 163 mg/dL — ABNORMAL HIGH (ref 70–99)

## 2014-04-29 LAB — BASIC METABOLIC PANEL
Anion gap: 11 (ref 5–15)
BUN: 27 mg/dL — AB (ref 6–23)
CO2: 27 mmol/L (ref 19–32)
Calcium: 9.1 mg/dL (ref 8.4–10.5)
Chloride: 99 mmol/L (ref 96–112)
Creatinine, Ser: 1.15 mg/dL — ABNORMAL HIGH (ref 0.50–1.10)
GFR calc Af Amer: 48 mL/min — ABNORMAL LOW (ref >90)
GFR calc non Af Amer: 42 mL/min — ABNORMAL LOW (ref >90)
GLUCOSE: 151 mg/dL — AB (ref 70–99)
POTASSIUM: 3.8 mmol/L (ref 3.5–5.1)
Sodium: 137 mmol/L (ref 135–145)

## 2014-04-29 LAB — MAGNESIUM: MAGNESIUM: 1.9 mg/dL (ref 1.5–2.5)

## 2014-04-29 MED ORDER — FUROSEMIDE 20 MG PO TABS
20.0000 mg | ORAL_TABLET | Freq: Every day | ORAL | Status: DC
  Administered 2014-04-30: 20 mg via ORAL
  Filled 2014-04-29 (×2): qty 1

## 2014-04-29 NOTE — Progress Notes (Signed)
  Progress Note    04/29/2014 12:23 PM 5 Days Post-Op  Subjective:  Feeling better  Afebrile VSS 95% 3LO2NC  Filed Vitals:   04/29/14 0422  BP: 148/47  Pulse: 56  Temp: 98.2 F (36.8 C)  Resp: 17    Physical Exam: Lungs:  Non labored Incisions:  C/d/i Extremities:  2+ palpable right DP; right foot is warm; gangrenous toes stable   CBC    Component Value Date/Time   WBC 6.7 04/29/2014 0542   RBC 3.69* 04/29/2014 0542   HGB 11.5* 04/29/2014 0542   HCT 34.8* 04/29/2014 0542   PLT 344 04/29/2014 0542   MCV 94.3 04/29/2014 0542   MCH 31.2 04/29/2014 0542   MCHC 33.0 04/29/2014 0542   RDW 13.8 04/29/2014 0542    BMET    Component Value Date/Time   NA 137 04/29/2014 0542   K 3.8 04/29/2014 0542   CL 99 04/29/2014 0542   CO2 27 04/29/2014 0542   GLUCOSE 151* 04/29/2014 0542   BUN 27* 04/29/2014 0542   CREATININE 1.15* 04/29/2014 0542   CALCIUM 9.1 04/29/2014 0542   GFRNONAA 42* 04/29/2014 0542   GFRAA 48* 04/29/2014 0542    INR    Component Value Date/Time   INR 1.18 04/18/2014 0323     Intake/Output Summary (Last 24 hours) at 04/29/14 1223 Last data filed at 04/29/14 0400  Gross per 24 hour  Intake    240 ml  Output      7 ml  Net    233 ml     Assessment:  81 y.o. female is s/p:  Right common femoral endarterectomy with profundoplasty, vein patch  5 Days Post-Op And Aortogram with right lower extremity runoff, right external iliac stent (self-expanding 7 x 30 Abbott) 8 Days Post-Op    Plan: -pt with palpable right DP pulse -DVT prophylaxis:  Eliquis -pt ready for d/c per cardiology, however, she has been placed on C diff precautions and will need a negative result prior to discharge per SNF.  Hopefully can get sample today. -dry dressing to groin daily  -dry dressing between toes   Leontine Locket, PA-C [facility] [phone] 04/29/2014 12:23 PM

## 2014-04-29 NOTE — Progress Notes (Signed)
    Subjective:  Patient feels well today. She is eager to be discharged. She denies chest pain or shortness of breath. No palpitations.  Objective:  Vital Signs in the last 24 hours: Temp:  [97.9 F (36.6 C)-98.6 F (37 C)] 98.2 F (36.8 C) (03/22 0422) Pulse Rate:  [47-56] 56 (03/22 0422) Resp:  [16-18] 17 (03/22 0422) BP: (132-148)/(43-72) 148/47 mmHg (03/22 0422) SpO2:  [94 %-100 %] 95 % (03/22 0422) Weight:  [207 lb 10.8 oz (94.2 kg)] 207 lb 10.8 oz (94.2 kg) (03/22 0422)  Intake/Output from previous day: 03/21 0701 - 03/22 0700 In: 480 [P.O.:480] Out: 11 [Urine:8; Stool:3]  Physical Exam: Pt is alert and oriented, pleasant elderly woman in NAD HEENT: normal Neck: JVP - normal Lungs: CTA bilaterally CV: RRR without murmur or gallop Abd: soft, NT, Positive BS, no hepatomegaly Ext: no C/C/E Skin: warm/dry no rash Lab Results:  Recent Labs  04/28/14 0507 04/29/14 0542  WBC 7.1 6.7  HGB 10.9* 11.5*  PLT 335 344    Recent Labs  04/27/14 2350 04/29/14 0542  NA 135 137  K 4.1 3.8  CL 101 99  CO2 29 27  GLUCOSE 160* 151*  BUN 24* 27*  CREATININE 1.26* 1.15*   No results for input(s): TROPONINI in the last 72 hours.  Invalid input(s): CK, MB  Cardiac Studies: 3.15.2016: Study Conclusions  - Left ventricle: The cavity size was normal. Wall thickness was normal. Systolic function was normal. The estimated ejection fraction was in the range of 55% to 60%. Wall motion was normal; there were no regional wall motion abnormalities. The study is not technically sufficient to allow evaluation of LV diastolic function. - Mitral valve: Mildly thickened leaflets . There was mild regurgitation. - Left atrium: Severely dilated >50 ml/m2. - Inferior vena cava: The vessel was normal in size. The respirophasic diameter changes were in the normal range (>= 50%), consistent with normal central venous pressure.  Impressions:  - LVEF 55-60%, normal  wall motion, normal wall thickness, mild MR, severe LAE.  Tele: Personally reviewed: Sinus rhythm without SVT or atrial fibrillation  Assessment/Plan:  1. Acute on chronic diastolic CHF: I/O's not recorded as patient incontinent. Weight back down to baseline with increased IV diuretics yesterday and creatinine stable. Volume status looks much better today. Recommend changing her back to oral furosemide. From a cardiac perspective, she is stable for discharge.  2. Atrial fibrillation: maintaining sinus rhythm on sotalol and anticoagulated with Eliquis.  3. PSVT: No recurrence on telemetry  Patient appears stable from a cardiac perspective. Will write for oral furosemide as a maintenance diuretic. Please feel free to call if any other cardiac issues arise.  Sherren Mocha, M.D. 04/29/2014, 11:03 AM

## 2014-04-29 NOTE — Progress Notes (Signed)
Medical Consultation PROGRESS NOTE  Julia Goodman YJE:[id number] DOB: December 26, 1925 DOA: 04/17/2014 PCP: Moshe Cipro, MD  HPI: Julia Goodman is a 81 y.o. female who approximately 3 weeks ago injured her right foot on a wheelchair. She developed a blister and wounds on the third and fourth toes of the right foot. She was seen by her podiatrist 3/10. He discussed the case with Dr. Oneida Alar. Dr. Oneida Alar recommended [facility]. Patient is admitted to vascular surgery, hospitalist consulted for medical management.  Subjective / 24 H Interval events -continue to report frequent loose stool  x4 last 24hrs. No fever, no leukocytosis, no ab pain. Denies sob, no cough. c/o excessive urination with higher dose of lasix. Daughter not at bedside.  Assessment/Plan: Principal Problem:   Gangrene associated with diabetes mellitus Active Problems:   COPD (chronic obstructive pulmonary disease)   CKD (chronic kidney disease), stage III   Diabetes mellitus   Atrial fibrillation   CAD in native artery   Chronic combined systolic and diastolic HF (heart failure)   PSVT (paroxysmal supraventricular tachycardia)   Cellulitis of toe of right foot   Chronic respiratory failure with hypoxia  Frequent loose stool, no fever, no leukocytosis, no ab pain, awaiting c diff result.  Chronic diastolic/systolic chf,  crackles on 3/18, on 5liter oxygen 3/18 (on 3liter at home), restart lasix 20mg x1 3/18 night, lung exam improved on 3/19 am,  lasix d/ed on 3/19 due to cr trending up. C/o sob/cough/edema 3/20 evening, additional lasix given as above. Sob/cough/edema 3/20 evening:   CXR+ pulmonary edema. Ivf d/ced lasix40mg  iv x1, change iv abx to oral abx to doxycyline 3/21 am better, still some crackles, continue iv lasix 40qd.replace mag.cards give additional iv lasix on 3/21. 3/22, minimal basilar crackles, breath better, LE edema resolving, cards recommended change to oral lasix  20mg  po qd.   DM  - controlled with most recent A1C of 6.3 - with renal and vascular complications - continue home medications with glipizide and Tradjenta, SSI - CBGs with good control  COPD  - stable - she has mild wheezing likely in the setting of mild fluid overload - Lasix per cardiology, 2D echo with normal EF, LA dilatation - continue xopenex - incentive spirometry  - continue oxygen, she is on chronic O2  CKD stage III - discontinued Relafen, patient has been on this for years, scheduled three times daily - Cr at baseline   Right foot gangrene /cellulitis - vanc/zosyn d/ced on 3/20, doxycycline started on 3/20 -per vascular , s/p Right common femoral endarterectomy with profundoplasty, vein patch -per vascular, no need of further abx, abx d/ced on 3/22  History of PAF , paroxysmal  -oral anticoagulation on hold, on heprin gtt prior to surgery, restarted eliquis post op on 3/19 per vascular surgery -had a brief episode of SVT on 3/17night. - sotalol was stopped on 3/14 for unclear reason, cardiology restarted sotalol on 3/18 - currently in sinus rhythm on telemetry  Lung mass / Malignancy  - followed at [facility] s/p ablation 11/2013.  - outpatient follow up, supposed to have a repeat CT scan in April  We'll continue to follow, thank you for this consult.    Diet: Diet heart healthy/carb modified Fluids: KVO DVT Prophylaxis: heparin gtt/ transition to eliquis per pharmacy on 3/19  Code Status: Full Code Family Communication: patient and daughter Disposition Plan: SNF if c diff negative  Procedures: Right common femoral endarterectomy with profundoplasty, vein patch 3/17  Antibiotics Vancomycin 3/10 >>3/20 Zosyn 3/10 >>3/20 Doxycycline 3/20-3/22   Studies  Dg Chest Port 1 View  04/28/2014   CLINICAL DATA:  Acute onset of cough.  Subsequent encounter.  EXAM: PORTABLE CHEST - 1 VIEW  COMPARISON:  Chest radiograph performed 04/22/2014  FINDINGS: The lungs are  well-aerated. Vascular congestion is again noted, with mild bibasilar opacities and small bilateral pleural effusions. This is similar in appearance to the prior study, though effusions are slightly larger. No pneumothorax is identified.  The cardiomediastinal silhouette is borderline normal in size. No acute osseous abnormalities are seen.  IMPRESSION: Vascular congestion again noted, with mild bibasilar opacities and small bilateral pleural effusions. This is compatible with pulmonary edema and is relatively similar to the prior study, though effusions are slightly larger.   Electronically Signed   By: Garald Balding M.D.   On: 04/28/2014 00:41    Objective  Filed Vitals:   04/28/14 1300 04/28/14 2008 04/29/14 0140 04/29/14 0422  BP: 132/72 148/43  148/47  Pulse: 47 52  56  Temp: 97.9 F (36.6 C) 98.6 F (37 C)  98.2 F (36.8 C)  TempSrc: Oral Oral  Oral  Resp: 16 18  17   Height:      Weight:    94.2 kg (207 lb 10.8 oz)  SpO2: 100% 97% 94% 95%    Intake/Output Summary (Last 24 hours) at 04/29/14 1321 Last data filed at 04/29/14 0400  Gross per 24 hour  Intake    240 ml  Output      7 ml  Net    233 ml   Filed Weights   04/27/14 0548 04/28/14 0810 04/29/14 0422  Weight: 96.9 kg (213 lb 10 oz) 96.7 kg (213 lb 3 oz) 94.2 kg (207 lb 10.8 oz)   Exam:  General:  NAD  HEENT: no scleral icterus  Cardiovascular: RRR without MRG  Respiratory: mild crackles at bases, no wheezing,   Abdomen: soft, non tender  MSK/Extremities: right foot wound less erythema/less edema/ improved peripheral pulse, still tender to palpation.  Skin: no rashes  Neuro: non focal   Data Reviewed: Basic Metabolic Panel:  Recent Labs Lab 04/25/14 0250 04/26/14 0251 04/27/14 0527 04/27/14 2350 04/28/14 0507 04/29/14 0542  NA 139 134* 135 135  --  137  K 4.4 4.3 4.0 4.1  --  3.8  CL 103 101 104 101  --  99  CO2 26 26 25 29   --  27  GLUCOSE 118* 134* 150* 160*  --  151*  BUN 14 21 19  24*  --   27*  CREATININE 1.13* 1.42* 1.26* 1.26*  --  1.15*  CALCIUM 9.2 8.6 8.6 8.8  --  9.1  MG  --  1.5  --   --  1.6 1.9   Liver Function Tests:  Recent Labs Lab 04/27/14 2350  AST 22  ALT 23  ALKPHOS 63  BILITOT 0.5  PROT 6.5  ALBUMIN 2.4*   CBC:  Recent Labs Lab 04/25/14 0250 04/26/14 0251 04/27/14 0527 04/28/14 0507 04/29/14 0542  WBC 8.0 8.1 6.6 7.1 6.7  HGB 11.5* 10.7* 10.0* 10.9* 11.5*  HCT 36.1 32.8* 31.5* 33.6* 34.8*  MCV 96.0 96.2 96.6 96.6 94.3  PLT 409* 337 298 335 344   CBG:  Recent Labs Lab 04/28/14 0802 04/28/14 1128 04/28/14 1752 04/28/14 2142 04/29/14 0627  GLUCAP 143* 209* 123* 155* 134*    No results found for this or any previous visit (from the past 240 hour(s)).  Scheduled Meds: . apixaban  2.5 mg Oral BID  . escitalopram  5 mg Oral BID  . feeding supplement (GLUCERNA SHAKE)  237 mL Oral TID WC  . folic acid  1 mg Oral q morning - 10a  . [START ON 04/30/2014] furosemide  20 mg Oral Daily  . guaiFENesin  600 mg Oral BID  . insulin aspart  0-15 Units Subcutaneous TID WC  . lactobacillus  1 g Oral TID WC  . linagliptin  5 mg Oral Daily  . pantoprazole  40 mg Oral Daily  . sotalol  80 mg Oral Q12H   Continuous Infusions:    Florencia Reasons, MD/PhD [facility] Pager [phone]. If 7 PM - 7 AM, please contact night-coverage at [email or web address], password [email or web address] 04/29/2014, 1:21 PM  LOS: 11 days

## 2014-04-29 NOTE — Clinical Social Work Note (Signed)
CSW spoke to patient's daughter who would like patient to go to [facility].  CSW contacted [facility] who said they will review patient's information to determine if they can take her.  Patient's information was faxed to SNF, awaiting call back from facility.  Jones Broom. [place], MSW, [place] 04/29/2014 12:18 PM

## 2014-04-29 NOTE — Progress Notes (Signed)
Physical Therapy Treatment Patient Details Name: ANAIRIS KNICK MRN: [id number] DOB: Nov 20, 1925 Today's Date: May 16, 2014    History of Present Illness pt presents post R Femoral Endartectomy with profunoplasty.      PT Comments    Treatment limited to in bed mobility and LE therex due to fatigue from already OOB twice today.  Multiple rest breaks and cues to breathe in through nose due to dyspnea.  Continue to recommend SNF rehab at d/c.  Follow Up Recommendations  SNF     Equipment Recommendations  None recommended by PT    Recommendations for Other Services       Precautions / Restrictions Precautions Precautions: Fall Precaution Comments: contact enteric    Mobility  Bed Mobility Overal bed mobility: Needs Assistance Bed Mobility: Rolling           General bed mobility comments: with rail and minguard, then scooting up in bed mod assist bed in trendelenberg and using rails, cues to use feet on bed and rails to pull with UE's   Transfers                 General transfer comment: NT, pt up OOB already today and just back to bed after toileting  Ambulation/Gait                 Stairs            Wheelchair Mobility    Modified Rankin (Stroke Patients Only)       Balance                                    Cognition Arousal/Alertness: Awake/alert Behavior During Therapy: WFL for tasks assessed/performed Overall Cognitive Status: Within Functional Limits for tasks assessed                      Exercises General Exercises - Lower Extremity Ankle Circles/Pumps: AROM;Both;15 reps;Supine Short Arc Quad: AROM;Strengthening;Both;5 reps;10 reps;Supine Heel Slides: AROM;Both;10 reps;Supine Low Level/ICU Exercises Stabilized Bridging: AROM;Both;Supine;10 reps    General Comments        Pertinent Vitals/Pain Faces Pain Scale: Hurts even more Pain Location: right foot  Pain Descriptors / Indicators: Tightness Pain  Intervention(s): Monitored during session;Limited activity within patient's tolerance    Home Living                      Prior Function            PT Goals (current goals can now be found in the care plan section) Progress towards PT goals: Progressing toward goals    Frequency  Min 2X/week    PT Plan Current plan remains appropriate    Co-evaluation             End of Session Equipment Utilized During Treatment: Oxygen Activity Tolerance: Patient limited by fatigue Patient left: in bed;with call bell/phone within reach     Time: 1659-1714 PT Time Calculation (min) (ACUTE ONLY): 15 min  Charges:  $Therapeutic Exercise: 8-22 mins                    G Codes:      WYNN,CYNDI 2014-05-16, 5:25 PM  Magda Kiel, [place] 16-May-2014

## 2014-04-30 LAB — BASIC METABOLIC PANEL
Anion gap: 8 (ref 5–15)
BUN: 22 mg/dL (ref 6–23)
CO2: 27 mmol/L (ref 19–32)
Calcium: 9.1 mg/dL (ref 8.4–10.5)
Chloride: 102 mmol/L (ref 96–112)
Creatinine, Ser: 1.01 mg/dL (ref 0.50–1.10)
GFR calc non Af Amer: 49 mL/min — ABNORMAL LOW (ref >90)
GFR, EST AFRICAN AMERICAN: 57 mL/min — AB (ref >90)
GLUCOSE: 134 mg/dL — AB (ref 70–99)
Potassium: 4 mmol/L (ref 3.5–5.1)
SODIUM: 137 mmol/L (ref 135–145)

## 2014-04-30 LAB — CLOSTRIDIUM DIFFICILE BY PCR: CDIFFPCR: NEGATIVE

## 2014-04-30 LAB — GLUCOSE, CAPILLARY
Glucose-Capillary: 140 mg/dL — ABNORMAL HIGH (ref 70–99)
Glucose-Capillary: 167 mg/dL — ABNORMAL HIGH (ref 70–99)
Glucose-Capillary: 102 mg/dL — ABNORMAL HIGH (ref 70–99)

## 2014-04-30 LAB — MAGNESIUM: MAGNESIUM: 1.7 mg/dL (ref 1.5–2.5)

## 2014-04-30 MED ORDER — GLUCERNA SHAKE PO LIQD: 237.0000 mL | Freq: Three times a day (TID) | ORAL | Status: DC

## 2014-04-30 MED ORDER — TRAMADOL HCL 50 MG PO TABS: 50.0000 mg | ORAL_TABLET | Freq: Four times a day (QID) | ORAL | Status: AC | PRN

## 2014-04-30 MED ORDER — FUROSEMIDE 20 MG PO TABS: 20.0000 mg | ORAL_TABLET | Freq: Every day | ORAL | Status: DC

## 2014-04-30 NOTE — Clinical Social Work Note (Signed)
CSW spoke with patient and her daughter Shirlean Mylar to discuss bed offers for patient.  Originally patient wanted to go to [facility] in [place], but patient and family decided she would rather go to [facility] and [facility].  CSW contacted facility to confirm they can take patient tomorrow, which they said yes.  Patient and daughter were notified, that plan is to discharge tomorrow once patient is medically ready and discharge orders have been received.  Jones Broom. [place], MSW, [place] 04/30/2014 5:02 PM

## 2014-04-30 NOTE — Progress Notes (Signed)
Medical Consultation PROGRESS NOTE  Julia Goodman KTG:[id number] DOB: 03/31/1925 DOA: 04/17/2014 PCP: Moshe Cipro, MD  HPI: Julia Goodman is a 81 y.o. female who approximately 3 weeks ago injured her right foot on a wheelchair. She developed a blister and wounds on the third and fourth toes of the right foot. She was seen by her podiatrist 3/10. He discussed the case with Dr. Oneida Alar. Dr. Oneida Alar recommended [facility]. Patient is admitted to vascular surgery, hospitalist consulted for medical management.  Subjective / 24 H Interval events -no further diarrhea  Assessment/Plan: Frequent loose stool - no fever or leukocytosis -resolved -Cdiff PCR negative  Acute on Chronic diastolic/systolic chf,  crackles on 3/18, on 5liter oxygen 3/18 (on 3liter at home), restarted lasix 20mg x1 3/18 night, lung exam improved on 3/19 am,  lasix d/ed on 3/19 due to cr trending up. C/o sob/cough/edema 3/20 evening, additional lasix given as above. Sob/cough/edema 3/20 evening:   CXR+ pulmonary edema. Ivf d/ced lasix40mg  iv x1, change iv abx to oral abx to doxycyline 3/21 am better, still some crackles, continue iv lasix 40qd.replace mag.cards give additional iv lasix on 3/21. 3/22, minimal basilar crackles, breath better, LE edema resolving, cards recommended change to oral lasix 20mg  po qd, now stable  DM  - controlled with most recent A1C of 6.3 - continue home medications with glipizide and Tradjenta, SSI - CBGs 130-160  COPD  - stable - continue xopenex - incentive spirometry  - continue oxygen, she is on chronic O2  CKD stage III - discontinued Relafen, patient has been on this for years, scheduled three times daily - Cr at baseline   Right foot gangrene /cellulitis - vanc/zosyn d/ced on 3/20, doxycycline started on 3/20 -per vascular , s/p Right common femoral endarterectomy with profundoplasty, vein patch -per vascular, no need of further abx,  abx d/ced on 3/22  History of PAF , paroxysmal  -oral anticoagulation on hold, on heprin gtt prior to surgery, restarted eliquis post op on 3/19 per vascular surgery -had a brief episode of SVT on 3/17night. - sotalol was stopped on 3/14 for unclear reason, cardiology restarted sotalol on 3/18  Lung mass / Malignancy  - followed at [facility] s/p ablation 11/2013.  - outpatient follow up, supposed to have a repeat CT scan in April   Procedures: Right common femoral endarterectomy with profundoplasty, vein patch 3/17   Antibiotics Vancomycin 3/10 >>3/20 Zosyn 3/10 >>3/20 Doxycycline 3/20-3/22   Studies  No results found.  Objective  Filed Vitals:   04/29/14 0140 04/29/14 0422 04/29/14 2003 04/30/14 0512  BP:  148/47 157/57 143/78  Pulse:  56 51 57  Temp:  98.2 F (36.8 C) 97.8 F (36.6 C) 97.5 F (36.4 C)  TempSrc:  Oral Oral Oral  Resp:  17 18 16   Height:      Weight:  94.2 kg (207 lb 10.8 oz)  93.5 kg (206 lb 2.1 oz)  SpO2: 94% 95% 92% 95%    Intake/Output Summary (Last 24 hours) at 04/30/14 1420 Last data filed at 04/30/14 0815  Gross per 24 hour  Intake    120 ml  Output      0 ml  Net    120 ml   Filed Weights   04/28/14 0810 04/29/14 0422 04/30/14 0512  Weight: 96.7 kg (213 lb 3 oz) 94.2 kg (207 lb 10.8 oz) 93.5 kg (206 lb 2.1 oz)   Exam:  General:  NAD  HEENT: no  scleral icterus  Cardiovascular: RRR without MRG  Respiratory: mild crackles at bases, no wheezing,   Abdomen: soft, non tender  MSK/Extremities: right foot wound less erythema/less edema/ improved peripheral pulse, minimally tender to palpation.  Skin: no rashes  Neuro: non focal   Data Reviewed: Basic Metabolic Panel:  Recent Labs Lab 04/26/14 0251 04/27/14 0527 04/27/14 2350 04/28/14 0507 04/29/14 0542 04/30/14 0424  NA 134* 135 135  --  137 137  K 4.3 4.0 4.1  --  3.8 4.0  CL 101 104 101  --  99 102  CO2 26 25 29   --  27 27  GLUCOSE 134* 150* 160*  --  151* 134*  BUN  21 19 24*  --  27* 22  CREATININE 1.42* 1.26* 1.26*  --  1.15* 1.01  CALCIUM 8.6 8.6 8.8  --  9.1 9.1  MG 1.5  --   --  1.6 1.9 1.7   Liver Function Tests:  Recent Labs Lab 04/27/14 2350  AST 22  ALT 23  ALKPHOS 63  BILITOT 0.5  PROT 6.5  ALBUMIN 2.4*   CBC:  Recent Labs Lab 04/25/14 0250 04/26/14 0251 04/27/14 0527 04/28/14 0507 04/29/14 0542  WBC 8.0 8.1 6.6 7.1 6.7  HGB 11.5* 10.7* 10.0* 10.9* 11.5*  HCT 36.1 32.8* 31.5* 33.6* 34.8*  MCV 96.0 96.2 96.6 96.6 94.3  PLT 409* 337 298 335 344   CBG:  Recent Labs Lab 04/28/14 2142 04/29/14 0627 04/29/14 2123 04/30/14 0619 04/30/14 1149  GLUCAP 155* 134* 163* 140* 167*    Recent Results (from the past 240 hour(s))  Clostridium Difficile by PCR     Status: None   Collection Time: 04/29/14  9:08 PM  Result Value Ref Range Status   C difficile by pcr NEGATIVE NEGATIVE Final     Scheduled Meds: . apixaban  2.5 mg Oral BID  . escitalopram  5 mg Oral BID  . feeding supplement (GLUCERNA SHAKE)  237 mL Oral TID WC  . folic acid  1 mg Oral q morning - 10a  . furosemide  20 mg Oral Daily  . guaiFENesin  600 mg Oral BID  . insulin aspart  0-15 Units Subcutaneous TID WC  . lactobacillus  1 g Oral TID WC  . linagliptin  5 mg Oral Daily  . pantoprazole  40 mg Oral Daily  . sotalol  80 mg Oral Q12H   Continuous Infusions:    Domenic Polite, MD [facility] Pager [phone]. If 7 PM - 7 AM, please contact night-coverage at [email or web address], password [email or web address] 04/30/2014, 2:20 PM  LOS: 12 days

## 2014-04-30 NOTE — Discharge Summary (Signed)
Discharge Summary     Julia Goodman 08-26-1925 81 y.o. female  [id number]  Admission Date: 04/17/2014  Discharge Date: 05/01/14  Physician: Elam Dutch, MD  Admission Diagnosis: Cellulitis of toe of right foot [L03.031]   HPI:   This is a 81 y.o. female who approximately 3 weeks ago injured her right foot on a wheelchair. She developed a blister and wounds on the third and fourth toes of the right foot. She was seen by her podiatrist 3/10. He discussed the case with Dr. Oneida Alar. Dr. Oneida Alar [facility] Patient is admitted to vascular surgery, hospitalist consulted for medical management.  Hospital Course:  She was admitted to the hospital and continued on IV ABx and her Eliquis was discontinued for her arteriogram.  The patient was admitted to the hospital and taken to the [facility] lab on 04/21/14 and underwent 04/17/2014 with the following findings: #1 80% stenosis mid right external iliac artery stented to 0% residual stenosis with 7 x 30 Abbott self-expanding stent   #2 subtotal occlusion right profunda femoris origin  #3 patent left common iliac stent #4 patent right superficial femoral popliteal with 3 vessel tibial runoff  A cardiology consult was obtained for cardiac clearance.  Her nuclear stress test revealed moderate to high risk risk for surgery and is cleared for surgery by cardiology.  She did have a 2D echo, which revealed:   Study Conclusions - Left ventricle: The cavity size was normal. Wall thickness was normal. Systolic function was normal. The estimated ejection fraction was in the range of 55% to 60%. Wall motion was normal; there were no regional wall motion abnormalities. The study is not technically sufficient to allow evaluation of LV diastolic function. - Mitral valve: Mildly thickened leaflets . There was mild regurgitation. - Left atrium: Severely dilated >50 ml/m2. - Inferior vena  cava: The vessel was normal in size. The respirophasic diameter changes were in the normal range (>= 50%), consistent with normal central venous pressure. Impressions: - LVEF 55-60%, normal wall motion, normal wall thickness, mild MR, severe LAE.  IM consult was obtained  for medical management of DM, COPD, CKD III, Hx PAF, and lung mass/malignancy.  Her Relafen was discontinued due to her renal function.  She was taken to the operating room on 04/25/2014 and underwent: Right common femoral endarterectomy with profundoplasty, vein patch.    The pt tolerated the procedure well and was transported to the PACU in good condition.   Postoperatively, she did have a palpable 2+ right DP pulse.  By POD 1, she was doing well with palpable right DP pulse.  She was in rapid Afib.  Cardiology discontinued her metoprolol and resumed her home dose of sotalol.  She did have PSVT and this did resolve.  ABI's on POD 1 were as follows:  RIGHT    LEFT    PRESSURE WAVEFORM  PRESSURE WAVEFORM  BRACHIAL 136 Triphasic  BRACHIAL 126 Triphasic   DP 116 Monophasic DP 49 Dampened monophasic   AT   AT    PT 67 Monophasic PT 55 Dampened monophasic   PER   PER    GREAT TOE  NA GREAT TOE  NA    RIGHT LEFT  ABI 0.85 0.4       She was transferred to the telemetry floor on POD 2.  By POD 4, her toe wounds were improving and her ABx were discontinued.  Cardiology changed her po diuresis to daily.  She continued to remain in NSR.  She is anticoagulated with Eliquis.  She was having some diarrhea and and a C diff was sent and this was negative.  She is discharged to a SNF on POD 10.  The remainder of the hospital course consisted of increasing mobilization and increasing intake of solids without difficulty.  CBC    Component Value Date/Time   WBC 6.7 04/29/2014 0542   RBC 3.69* 04/29/2014 0542   HGB 11.5* 04/29/2014 0542   HCT 34.8* 04/29/2014  0542   PLT 344 04/29/2014 0542   MCV 94.3 04/29/2014 0542   MCH 31.2 04/29/2014 0542   MCHC 33.0 04/29/2014 0542   RDW 13.8 04/29/2014 0542    BMET    Component Value Date/Time   NA 137 04/30/2014 0424   K 4.0 04/30/2014 0424   CL 102 04/30/2014 0424   CO2 27 04/30/2014 0424   GLUCOSE 134* 04/30/2014 0424   BUN 22 04/30/2014 0424   CREATININE 1.01 04/30/2014 0424   CALCIUM 9.1 04/30/2014 0424   GFRNONAA 49* 04/30/2014 0424   GFRAA 34* 04/30/2014 0424           Discharge Instructions    Call MD for:  redness, tenderness, or signs of infection (pain, swelling, bleeding, redness, odor or green/yellow discharge around incision site)    Complete by:  As directed      Call MD for:  severe or increased pain, loss or decreased feeling  in affected limb(s)    Complete by:  As directed      Call MD for:  temperature >100.5    Complete by:  As directed      Discharge wound care:    Complete by:  As directed   Wash the groin wound with soap and water daily and pat dry. (No tub bath-only shower)  Then put a dry gauze or washcloth there to keep this area dry daily and as needed.  Do not use Vaseline or neosporin on your incisions.  Only use soap and water on your incisions and then protect and keep dry. Place dry gauze between toes right foot daily     Lifting restrictions    Complete by:  As directed   No lifting for 2 weeks     Resume previous diet    Complete by:  As directed            Discharge Diagnosis:  Cellulitis of toe of right foot [L03.031]  Secondary Diagnosis: Patient Active Problem List   Diagnosis Date Noted  . PSVT (paroxysmal supraventricular tachycardia) 04/25/2014  . Cellulitis of toe of right foot   . Chronic respiratory failure with hypoxia   . CAD in native artery 04/22/2014  . Chronic combined systolic and diastolic HF (heart failure) 04/22/2014  . COPD (chronic obstructive pulmonary disease) 04/19/2014  . CKD (chronic kidney disease), stage III  04/19/2014  . Diabetes mellitus 04/19/2014  . Atrial fibrillation 04/19/2014  . Gangrene associated with diabetes mellitus 04/18/2014  . Atherosclerosis of native arteries of the extremities with ulceration(440.23) 07/11/2013  . Atherosclerosis of native arteries of the extremities with intermittent claudication 05/19/2011  . Peripheral vascular disease, unspecified 11/04/2010   Past Medical History  Diagnosis Date  . Diabetes mellitus   . Leg pain   . COPD (chronic obstructive pulmonary disease)   . Cardiomyopathy   . Irregular heartbeat   . Coronary artery disease   . Myocardial infarction   . Peripheral vascular disease   .  Cancer     Skin Melanoma  . CHF (congestive heart failure)   . Arthritis     OSTEOARTHRITIS       Medication List    STOP taking these medications        nabumetone 500 MG tablet  Commonly known as:  RELAFEN      TAKE these medications        acetaminophen-codeine 300-30 MG per tablet  Commonly known as:  TYLENOL #3  Take 1 tablet by mouth every 4 (four) hours as needed (pain).     amitriptyline 25 MG tablet  Commonly known as:  ELAVIL  Take 25 mg by mouth at bedtime.     donepezil 10 MG tablet  Commonly known as:  ARICEPT  Take 10 mg by mouth at bedtime.     ELIQUIS 2.5 MG Tabs tablet  Generic drug:  apixaban  Take 2.5 mg by mouth 2 (two) times daily.     escitalopram 10 MG tablet  Commonly known as:  LEXAPRO  Take 5 mg by mouth 2 (two) times daily.     feeding supplement (GLUCERNA SHAKE) Liqd  Take 237 mLs by mouth 3 (three) times daily with meals.     fenofibrate 160 MG tablet  Take 160 mg by mouth every morning.     fexofenadine 180 MG tablet  Commonly known as:  ALLEGRA  Take 180 mg by mouth daily.     fish oil-omega-3 fatty acids 1000 MG capsule  Take 1 g by mouth every morning.     fluticasone 50 MCG/ACT nasal spray  Commonly known as:  FLONASE  Place 1 spray into both nostrils daily as needed for allergies.      FOLIC ACID PO  Take 1 tablet by mouth every morning.     furosemide 20 MG tablet  Commonly known as:  LASIX  Take 1 tablet (20 mg total) by mouth daily.     GINKGO PO  Take 1 tablet by mouth daily with supper.     glipiZIDE 5 MG tablet  Commonly known as:  GLUCOTROL  Take 10 mg by mouth 2 (two) times daily before a meal.     GLUCOSAMINE-CHONDROITIN PO  Take 1 tablet by mouth daily with supper.     ipratropium-albuterol 0.5-2.5 (3) MG/3ML Soln  Commonly known as:  DUONEB  Take 3 mLs by nebulization 2 (two) times daily as needed (shortness of breath).     multivitamin with minerals Tabs tablet  Take 0.5 tablets by mouth 2 (two) times daily.     nitroGLYCERIN 0.4 mg/hr patch  Commonly known as:  NITRODUR - Dosed in mg/24 hr  Place 0.4 mg onto the skin every morning.     OSCAL 500/200 D-3 PO  Take 1 tablet by mouth daily with supper.     pravastatin 80 MG tablet  Commonly known as:  PRAVACHOL  Take 80 mg by mouth every evening.     sitaGLIPtin 100 MG tablet  Commonly known as:  JANUVIA  Take 100 mg by mouth every morning.     sotalol 80 MG tablet  Commonly known as:  BETAPACE  Take 80 mg by mouth 2 (two) times daily.     traMADol 50 MG tablet  Commonly known as:  ULTRAM  Take 1 tablet (50 mg total) by mouth every 6 (six) hours as needed for moderate pain.     Vitamin D (Ergocalciferol) 50000 UNITS Caps capsule  Commonly known as:  DRISDOL  Take  50,000 Units by mouth See admin instructions. 5 times monthly        Prescriptions given: 1.  Tramadol #30 No Refill  Instructions: 1.  Keep dry dressing between toes on right foot. 2.  Wash the groin wound with soap and water daily and pat dry. (No tub bath-only shower)  Then put a dry gauze or washcloth there to keep this area dry daily and as needed.  Do not use Vaseline or neosporin on your incisions.  Only use soap and water on your incisions and then protect and keep dry. 3.  Continue to monitor renal function and  Potassium  Disposition: SNF  Patient's condition: is Good  Follow up: 1. Dr. Oneida Alar in 2 weeks   Leontine Locket, PA-C [facility] [phone] 04/30/2014  4:29 PM    - For VQI Registry use --- Instructions: Press F2 to tab through selections.  Delete question if not applicable.   Post-op:  Wound infection: No  Graft infection: No  Transfusion: No  If yes, n/a units given New Arrhythmia: No Ipsilateral amputation: No, [ ]  Minor, [ ]  BKA, [ ]  AKA Discharge patency: [ x] Primary, [ ]  Primary assisted, [ ]  Secondary, [ ]  Occluded Patency judged by: [ ]  Dopper only, [ ]  Palpable graft pulse, [ x] Palpable distal pulse, [ ]  ABI inc. > 0.15, [ ]  Duplex Discharge ABI: R 0.85, L 0.4 Discharge TBI: R , L  D/C Ambulatory Status: Ambulatory with Assistance  Complications: MI: No, [ ]  Troponin only, [ ]  EKG or Clinical CHF: Yes Resp failure:No, [ ]  Pneumonia, [ ]  Ventilator Chg in renal function: No, [ ]  Inc. Cr > 0.5, [ ]  Temp. Dialysis, [ ]  Permanent dialysis Stroke: No, [ ]  Minor, [ ]  Major Return to OR: No  Reason for return to OR: [ ]  Bleeding, [ ]  Infection, [ ]  Thrombosis, [ ]  Revision  Discharge medications: Statin use:  yes ASA use:  no Plavix use:  no Beta blocker use: yes Eliquis use: yes

## 2014-04-30 NOTE — Progress Notes (Signed)
C-Difficile PCR negative.  Enteric precautions discontinued.

## 2014-04-30 NOTE — Progress Notes (Addendum)
  Progress Note    04/30/2014 8:08 AM 6 Days Post-Op  Subjective:  States her foot feels pretty good right now  Afebrile VSS 95% 3LO2NC  Filed Vitals:   04/30/14 0512  BP: 143/78  Pulse: 57  Temp: 97.5 F (36.4 C)  Resp: 16    Physical Exam: Lungs:  Non labored Extremities:  Toes improving; palpable right DP; right foot is warm.   CBC    Component Value Date/Time   WBC 6.7 04/29/2014 0542   RBC 3.69* 04/29/2014 0542   HGB 11.5* 04/29/2014 0542   HCT 34.8* 04/29/2014 0542   PLT 344 04/29/2014 0542   MCV 94.3 04/29/2014 0542   MCH 31.2 04/29/2014 0542   MCHC 33.0 04/29/2014 0542   RDW 13.8 04/29/2014 0542    BMET    Component Value Date/Time   NA 137 04/30/2014 0424   K 4.0 04/30/2014 0424   CL 102 04/30/2014 0424   CO2 27 04/30/2014 0424   GLUCOSE 134* 04/30/2014 0424   BUN 22 04/30/2014 0424   CREATININE 1.01 04/30/2014 0424   CALCIUM 9.1 04/30/2014 0424   GFRNONAA 49* 04/30/2014 0424   GFRAA 57* 04/30/2014 0424    INR    Component Value Date/Time   INR 1.18 04/18/2014 0323     Intake/Output Summary (Last 24 hours) at 04/30/14 0712 Last data filed at 04/29/14 1230  Gross per 24 hour  Intake    240 ml  Output      1 ml  Net    239 ml     Assessment:  81 y.o. female is s/p:  Right common femoral endarterectomy with profundoplasty, vein patch  6 Days Post-Op And Aortogram with right lower extremity runoff, right external iliac stent (self-expanding 7 x 30 Abbott) 9 Days Post-Op     Plan: -C diff sample collected and is in process-hopefully negative and pt will be able to be discharged to SNF today -pt continues to have palpable right DP with warm foot.  Toes seem to be slightly improving. -DVT prophylaxis:  Eliquis   Leontine Locket, PA-C [facility] [phone] 04/30/2014 8:08 AM    I have examined the patient, reviewed and agree with above.  Senai Ramnath, MD 04/30/2014 3:54 PM

## 2014-04-30 NOTE — Discharge Instructions (Signed)

## 2014-05-01 ENCOUNTER — Telehealth: Payer: Self-pay | Admitting: Vascular Surgery

## 2014-05-01 LAB — GLUCOSE, CAPILLARY
GLUCOSE-CAPILLARY: 117 mg/dL — AB (ref 70–99)
Glucose-Capillary: 120 mg/dL — ABNORMAL HIGH (ref 70–99)

## 2014-05-01 NOTE — Telephone Encounter (Signed)
Left msg for pt re appt, dpm °

## 2014-05-01 NOTE — Progress Notes (Signed)
Patient ID: Julia Goodman, female   DOB: 1925/04/08, 82 y.o.   MRN: [id number] Comfortable. Ready for transfer to skilled nursing facility today. Discussed with patient and her daughter present. Still with pain in her foot which is improving. Groin incision healing. Table for transfer to skilled nursing facility today. Follow-up with Dr. Oneida Alar and 2-3 weeks.

## 2014-05-01 NOTE — Telephone Encounter (Signed)
-----   Message from Gabriel Earing, [place] sent at 04/30/2014  1:13 PM EDT ----- S/p Right common femoral endarterectomy with profundoplasty, vein patch 04/24/14.  F/u with Dr. Oneida Alar in 2 weeks.  Thanks, Aldona Bar

## 2014-05-01 NOTE — Care Management Note (Signed)
    Page 1 of 1   05/01/2014     4:31:14 PM CARE MANAGEMENT NOTE 05/01/2014  Patient:  Julia Goodman, Julia Goodman   Account Number:  [id number]  Date Initiated:  04/23/2014  Documentation initiated by:  Marvetta Gibbons  Subjective/Objective Assessment:   Pt admitted with open wound and right foot gangrene     Action/Plan:   PTA pt lived at home- may need SNF plan for OR on 3/16   Anticipated DC Date:  05/02/2014   Anticipated DC Plan:  [facility]  In-house referral  Clinical Social Worker      DC Planning Services  CM consult      Choice offered to / List presented to:             Status of service:  Completed, signed off Medicare Important Message given?  YES (If response is "NO", the following Medicare IM given date fields will be blank) Date Medicare IM given:  04/28/2014 Medicare IM given by:  Marvetta Gibbons Date Additional Medicare IM given:  05/01/2014 Additional Medicare IM given by:  Marvetta Gibbons  Discharge Disposition:  [place]  Per UR Regulation:  Reviewed for med. necessity/level of care/duration of stay  If discussed at [place] of Stay Meetings, dates discussed:   04/24/2014  04/29/2014  05/01/2014    Comments:

## 2014-05-01 NOTE — Progress Notes (Signed)
PT Cancellation Note  Patient Details Name: Julia Goodman MRN: [id number] DOB: 19-Mar-1925   Cancelled Treatment:    Reason Eval/Treat Not Completed: Patient declined, no reason specified Daughter reports patient has just been placed on bed pan. Followed up shortly after and daughter states patient is going to take an extended amount of time. Will follow up this afternoon as time allows.  Ellouise Newer 05/01/2014, 10:54 AM Elayne Snare, [place]

## 2014-05-01 NOTE — Progress Notes (Signed)
Pt discharged pt to SNF [facility].  Pt will be transported by daughter.  Daughter will be taking education on diet, activity,  Meds, and follow-up care and appointments.  Pt and daughter verbalized understanding.  Pt was given ultram and morphine prior to taking out IV.  IV and telemetry were removed.

## 2014-05-01 NOTE — Clinical Social Work Note (Addendum)
Patient to be d/c'ed today to [facility] in [place], [place].  Patient and family agreeable to plans will transport via ems RN to call report.  Patient and family thankful of placement assistance.  Evette Cristal, MSW, [place]

## 2014-05-14 ENCOUNTER — Encounter: Payer: Self-pay | Admitting: Vascular Surgery

## 2014-05-15 ENCOUNTER — Ambulatory Visit (INDEPENDENT_AMBULATORY_CARE_PROVIDER_SITE_OTHER): Payer: Self-pay | Admitting: Vascular Surgery

## 2014-05-15 ENCOUNTER — Encounter: Payer: Self-pay | Admitting: Vascular Surgery

## 2014-05-15 VITALS — BP 137/72 | HR 64 | Ht 66.0 in | Wt 204.5 lb

## 2014-05-15 DIAGNOSIS — I739 Peripheral vascular disease, unspecified: Secondary | ICD-10-CM

## 2014-05-15 NOTE — Progress Notes (Signed)
Patient is an 81 year old female who returns for postoperative follow-up today. She underwent right femoral endarterectomy with profundoplasty and a vein patch on March 17. She states her right foot is improving. The operation was done for nonhealing wounds of the right foot. She is minimally ambulatory. She is currently living at a skilled nursing facility. She has reported some drainage from her right groin incision. She denies any fever or chills. She is currently doing dry dressings to the right groin and right foot.  Physical exam:  Filed Vitals:   05/15/14 0951 05/15/14 1003  BP: 140/75 137/72  Pulse: 53 64  Height: 5\' 6"  (1.676 m)   Weight: 204 lb 8 oz (92.761 kg)   SpO2: 91%     Right lower extremity: Right groin incision with some maceration at the upper 3 cm. This was superficially debrided today. There is no significant drainage. There is no surrounding erythema. There is no obvious abscess.  Right foot healing gangrenous changes all toes are viable  Assessment: Doing well status post right femoral endarterectomy with profundoplasty.  Plan: Continue dry dressings right groin and between toes right foot. Patient will follow-up in 2-3 weeks to recheck wounds.  Ruta Hinds, MD [facility] [place] Office: [phone] Pager: [phone]

## 2014-05-31 HISTORY — PX: PACEMAKER INSERTION: SHX728

## 2014-06-05 ENCOUNTER — Ambulatory Visit: Payer: Medicare Other | Admitting: Vascular Surgery

## 2014-06-10 ENCOUNTER — Telehealth: Payer: Self-pay

## 2014-06-10 NOTE — Telephone Encounter (Addendum)
Rec'd phone call from [facility] RN to report changes in wound.  Reported right groin wound is larger, and has increased in depth.  Reported yellow tissue in wound bed.  Reported wound dimensions 4 cm  x 1.8 cm. x 1 cm. in depth.  Questioned if pt. needs to be referred to the Wound Clinic?   Call placed to pt's daughter, Maisha.  Daughter stated the [facility] RN made an appt. with the Wound Clinic, but she preferred to have Dr. Oneida Alar evaluate the wound, and decide if the pt. needed to go to the Wound Clinic.  Appt. given for 06/12/14 @ 3:45 PM.  Agrees with plan.

## 2014-06-11 ENCOUNTER — Encounter: Payer: Self-pay | Admitting: Vascular Surgery

## 2014-06-12 ENCOUNTER — Ambulatory Visit (INDEPENDENT_AMBULATORY_CARE_PROVIDER_SITE_OTHER): Payer: Self-pay | Admitting: Vascular Surgery

## 2014-06-12 ENCOUNTER — Encounter: Payer: Self-pay | Admitting: Vascular Surgery

## 2014-06-12 VITALS — BP 95/58 | HR 77 | Ht 66.0 in | Wt 204.0 lb

## 2014-06-12 DIAGNOSIS — I739 Peripheral vascular disease, unspecified: Secondary | ICD-10-CM

## 2014-06-12 MED ORDER — CARRASYN HYDROGEL WOUND DRESS EX GEL: CUTANEOUS | Status: AC | PRN

## 2014-06-12 NOTE — Progress Notes (Signed)
Patient is an 81 year old female who recently underwent right femoral endarterectomy with profundoplasty. This was done with a vein patch. She has had some difficulty healing of the upper portion of her right groin incision. She denies any fever or chills. She has had some thin clear drainage from the groin. Her right foot feels better. A dry gangrenous changes on her right foot are continuing to heal.  Physical exam:  Filed Vitals:   06/12/14 1542  BP: 95/58  Pulse: 77  Height: 5\' 6"  (1.676 m)  Weight: 204 lb (92.534 kg)  SpO2: 96%    Right groin: 3 cm x 3 cm opening upper half of right groin incision lower half of incision is well-healed no erythema no significant drainage some fibrinous exudate at the base of the wound but overall this is granulating deepest portion is 1-1/2 cm.  Right foot dry gangrenous tip of third toe.  Assessment: Slowly healing right groin incision but overall clean with no evidence of infection  Plan: Hydrogel dressing once daily right groin follow-up 2 weeks  Ruta Hinds, MD [facility] [place] Office: [phone] Pager: [phone]

## 2014-06-24 ENCOUNTER — Encounter: Payer: Self-pay | Admitting: Vascular Surgery

## 2014-06-26 ENCOUNTER — Encounter: Payer: Self-pay | Admitting: Vascular Surgery

## 2014-06-26 ENCOUNTER — Ambulatory Visit (INDEPENDENT_AMBULATORY_CARE_PROVIDER_SITE_OTHER): Payer: Self-pay | Admitting: Vascular Surgery

## 2014-06-26 VITALS — BP 121/76 | HR 76 | Temp 97.5°F | Resp 18 | Wt 230.0 lb

## 2014-06-26 DIAGNOSIS — I739 Peripheral vascular disease, unspecified: Secondary | ICD-10-CM

## 2014-06-26 NOTE — Progress Notes (Signed)
Patient is an 81 year old female who recently underwent right femoral endarterectomy with profundoplasty 04/24/2014. This was done with a vein patch. She has had some difficulty healing of the upper portion of her right groin incision. She denies any fever or chills. She has had some thin clear drainage from the groin. Her right foot feels better. A dry gangrenous changes on her right foot are continuing to heal.  Physical exam:  Filed Vitals:   06/26/14 1301  BP: 121/76  Pulse: 76  Temp: 97.5 F (36.4 C)  TempSrc: Oral  Resp: 18  Weight: 230 lb (104.327 kg)  SpO2: 95%   Right groin: 3 cm x 3 cm opening upper half of right groin incision lower half of incision is well-healed no erythema no significant drainage some fibrinous exudate at the base of the wound but overall this is granulating deepest portion is 1-1/2 cm.  Right foot dry gangrenous tip of third toe.  Assessment: Slowly healing right groin incision but overall clean with no evidence of infection  Plan: Hydrogel dressing once daily right groin follow-up 4 weeks  Ruta Hinds, MD [facility] [place] Office: [phone] Pager: [phone]

## 2014-07-16 ENCOUNTER — Telehealth: Payer: Self-pay

## 2014-07-16 NOTE — Telephone Encounter (Signed)
See phone message 07-16-14 for verbal continued wound care until August 07, 2014 with [facility].    Yazmin Locher Eldridge-Lewis, RMA, AMT

## 2014-08-06 ENCOUNTER — Encounter: Payer: Self-pay | Admitting: Vascular Surgery

## 2014-08-07 ENCOUNTER — Encounter: Payer: Self-pay | Admitting: Vascular Surgery

## 2014-08-07 ENCOUNTER — Ambulatory Visit (INDEPENDENT_AMBULATORY_CARE_PROVIDER_SITE_OTHER): Payer: Medicare Other | Admitting: Vascular Surgery

## 2014-08-07 VITALS — BP 136/70 | HR 76 | Temp 97.8°F | Resp 20 | Ht 65.0 in | Wt 206.0 lb

## 2014-08-07 DIAGNOSIS — I739 Peripheral vascular disease, unspecified: Secondary | ICD-10-CM

## 2014-08-07 NOTE — Progress Notes (Signed)
Patient is an 81 year old female who recently underwent right femoral endarterectomy with profundoplasty 04/24/2014. This was done with a vein patch. She has had some difficulty healing of the upper portion of her right groin incision. She denies any fever or chills. She has healed the groin. Her right foot feels better. The dry gangrenous changes on her right foot are healed. She also complains of some pain in her left foot which is in the forefoot and toes. This is intermittent. She does not really describe classic rest pain. She does not describe numbness or tingling in her toes.   Review of systems: She develops dyspnea with minimal exertion. She denies chest pain. She is somewhat ambulatory but uses a motorized scooter frequently.   Physical exam:  Filed Vitals:   08/07/14 1257  BP: 136/70  Pulse: 76  Temp: 97.8 F (36.6 C)  TempSrc: Oral  Resp: 20  Height: 5\' 5"  (1.651 m)  Weight: 206 lb (93.441 kg)  SpO2: 86%   Right groin: Healed Right foot dry gangrenous tip of third toe healed 2+ right DP pulse Left foot small skin tear over left Achilles 2 cm area of eschar beneath the fourth toe dorsal foot no palpable pedal pulses   Assessment: Status post right femoral endarterectomy right leg completely healed at this point possible signs and symptoms of rest pain left foot. Previous ABIs were 0.4 on the left side. She has not had a formal arteriogram of the left side recently.  Plan: Observation of left foot for now. The patient will follow-up with repeat ABIs in 3 months time. She'll return sooner if she develops nonhealing wounds on the foot or worsening pain.  Ruta Hinds, MD [facility] [place] Office: [phone] Pager: [phone]

## 2014-09-01 ENCOUNTER — Telehealth: Payer: Self-pay

## 2014-09-01 NOTE — Telephone Encounter (Signed)
Received discharge summary from [facility] stating Julia Goodman would be discharged on 08/12/2014 due to no further care needed. Progressed to family care.

## 2014-11-01 ENCOUNTER — Other Ambulatory Visit: Payer: Self-pay | Admitting: *Deleted

## 2014-11-01 DIAGNOSIS — I739 Peripheral vascular disease, unspecified: Secondary | ICD-10-CM

## 2014-11-18 ENCOUNTER — Encounter: Payer: Self-pay | Admitting: Vascular Surgery

## 2014-11-20 ENCOUNTER — Ambulatory Visit (INDEPENDENT_AMBULATORY_CARE_PROVIDER_SITE_OTHER): Payer: Medicare Other | Admitting: Vascular Surgery

## 2014-11-20 ENCOUNTER — Ambulatory Visit (HOSPITAL_COMMUNITY)
Admission: RE | Admit: 2014-11-20 | Discharge: 2014-11-20 | Disposition: A | Discharge status: Home / Self Care | Payer: Medicare Other | Source: Ambulatory Visit | Attending: Vascular Surgery | Admitting: Vascular Surgery

## 2014-11-20 ENCOUNTER — Encounter: Payer: Self-pay | Admitting: *Deleted

## 2014-11-20 VITALS — BP 136/69 | HR 76 | Temp 97.8°F | Resp 18 | Ht 65.5 in | Wt 206.0 lb

## 2014-11-20 DIAGNOSIS — I739 Peripheral vascular disease, unspecified: Secondary | ICD-10-CM

## 2014-11-20 DIAGNOSIS — E119 Type 2 diabetes mellitus without complications: Secondary | ICD-10-CM | POA: Diagnosis not present

## 2014-11-20 DIAGNOSIS — I2510 Atherosclerotic heart disease of native coronary artery without angina pectoris: Secondary | ICD-10-CM | POA: Diagnosis not present

## 2014-11-20 NOTE — Progress Notes (Signed)
Patient is an 81 year old female who recently underwent right femoral endarterectomy with profundoplasty 04/24/2014. This was done with a vein patch. She has had some difficulty healing of the upper portion of her right groin incision. She denies any fever or chills. She has healed the groin. Her right foot feels better. The dry gangrenous changes on her right foot are healed. She also complains of some pain in her left foot which is in the forefoot and toes. This is intermittent. She does not really describe classic rest pain. She does not describe numbness or tingling in her toes.   Review of systems: She develops dyspnea with minimal exertion. She is on 3 L of home oxygen. She denies chest pain. She is somewhat ambulatory but uses a motorized scooter frequently.   Physical exam:    Filed Vitals:   11/20/14 1238  BP: 136/69  Pulse: 76  Temp: 97.8 F (36.6 C)  TempSrc: Oral  Resp: 18  Height: 5' 5.5" (1.664 m)  Weight: 206 lb (93.441 kg)  SpO2: 92%   Right groin: Healed Right foot dry gangrenous tip of third toe healed 1+ right DP pulse Left foot  no palpable pedal pulses   Assessment: Status post right femoral endarterectomy right leg completely healed at this point . Previous ABIs were 0.4 on the left side today her 0.5. She has not had a formal arteriogram of the left side recently.  Plan: Observation of left foot for now. The patient will follow-up with repeat ABIs in 6 months time. She'll return sooner if she develops nonhealing wounds on the foot or worsening pain.  Ruta Hinds, MD [facility] [place] Office: [phone] Pager: [phone]

## 2015-05-12 ENCOUNTER — Encounter: Payer: Self-pay | Admitting: Vascular Surgery

## 2015-05-18 ENCOUNTER — Other Ambulatory Visit: Payer: Self-pay | Admitting: *Deleted

## 2015-05-18 DIAGNOSIS — I739 Peripheral vascular disease, unspecified: Secondary | ICD-10-CM

## 2015-05-21 ENCOUNTER — Ambulatory Visit (INDEPENDENT_AMBULATORY_CARE_PROVIDER_SITE_OTHER): Payer: Medicare Other | Admitting: Family

## 2015-05-21 ENCOUNTER — Ambulatory Visit (HOSPITAL_COMMUNITY)
Admission: RE | Admit: 2015-05-21 | Discharge: 2015-05-21 | Disposition: A | Discharge status: Home / Self Care | Payer: Medicare Other | Source: Ambulatory Visit | Attending: Vascular Surgery | Admitting: Vascular Surgery

## 2015-05-21 ENCOUNTER — Encounter: Payer: Self-pay | Admitting: Family

## 2015-05-21 VITALS — BP 140/80 | HR 80 | Temp 97.0°F | Resp 22 | Ht 64.0 in | Wt 208.0 lb

## 2015-05-21 DIAGNOSIS — I509 Heart failure, unspecified: Secondary | ICD-10-CM | POA: Insufficient documentation

## 2015-05-21 DIAGNOSIS — R0989 Other specified symptoms and signs involving the circulatory and respiratory systems: Secondary | ICD-10-CM | POA: Diagnosis present

## 2015-05-21 DIAGNOSIS — I739 Peripheral vascular disease, unspecified: Secondary | ICD-10-CM

## 2015-05-21 DIAGNOSIS — R938 Abnormal findings on diagnostic imaging of other specified body structures: Secondary | ICD-10-CM | POA: Diagnosis not present

## 2015-05-21 DIAGNOSIS — Z9889 Other specified postprocedural states: Secondary | ICD-10-CM

## 2015-05-21 DIAGNOSIS — Z48812 Encounter for surgical aftercare following surgery on the circulatory system: Secondary | ICD-10-CM

## 2015-05-21 DIAGNOSIS — Z95828 Presence of other vascular implants and grafts: Secondary | ICD-10-CM | POA: Diagnosis not present

## 2015-05-21 DIAGNOSIS — Z87891 Personal history of nicotine dependence: Secondary | ICD-10-CM | POA: Diagnosis not present

## 2015-05-21 DIAGNOSIS — E119 Type 2 diabetes mellitus without complications: Secondary | ICD-10-CM | POA: Insufficient documentation

## 2015-05-21 DIAGNOSIS — Z4889 Encounter for other specified surgical aftercare: Secondary | ICD-10-CM

## 2015-05-21 NOTE — Patient Instructions (Signed)
Peripheral Vascular Disease Peripheral vascular disease (PVD) is a disease of the blood vessels that are not part of your heart and brain. A simple term for PVD is poor circulation. In most cases, PVD narrows the blood vessels that carry blood from your heart to the rest of your body. This can result in a decreased supply of blood to your arms, legs, and internal organs, like your stomach or kidneys. However, it most often affects a person's lower legs and feet. There are two types of PVD.  Organic PVD. This is the more common type. It is caused by damage to the structure of blood vessels.  Functional PVD. This is caused by conditions that make blood vessels contract and tighten (spasm). Without treatment, PVD tends to get worse over time. PVD can also lead to acute ischemic limb. This is when an arm or limb suddenly has trouble getting enough blood. This is a medical emergency. CAUSES Each type of PVD has many different causes. The most common cause of PVD is buildup of a fatty material (plaque) inside of your arteries (atherosclerosis). Small amounts of plaque can break off from the walls of the blood vessels and become lodged in a smaller artery. This blocks blood flow and can cause acute ischemic limb. Other common causes of PVD include:  Blood clots that form inside of blood vessels.  Injuries to blood vessels.  Diseases that cause inflammation of blood vessels or cause blood vessel spasms.  Health behaviors and health history that increase your risk of developing PVD. RISK FACTORS  You may have a greater risk of PVD if you:  Have a family history of PVD.  Have certain medical conditions, including:  High cholesterol.  Diabetes.  High blood pressure (hypertension).  Coronary heart disease.  Past problems with blood clots.  Past injury, such as burns or a broken bone. These may have damaged blood vessels in your limbs.  Buerger disease. This is caused by inflamed blood  vessels in your hands and feet.  Some forms of arthritis.  Rare birth defects that affect the arteries in your legs.  Use tobacco.  Do not get enough exercise.  Are obese.  Are age 50 or older. SIGNS AND SYMPTOMS  PVD may cause many different symptoms. Your symptoms depend on what part of your body is not getting enough blood. Some common signs and symptoms include:  Cramps in your lower legs. This may be a symptom of poor leg circulation (claudication).  Pain and weakness in your legs while you are physically active that goes away when you rest (intermittent claudication).  Leg pain when at rest.  Leg numbness, tingling, or weakness.  Coldness in a leg or foot, especially when compared with the other leg.  Skin or hair changes. These can include:  Hair loss.  Shiny skin.  Pale or bluish skin.  Thick toenails.  Inability to get or maintain an erection (erectile dysfunction). People with PVD are more prone to developing ulcers and sores on their toes, feet, or legs. These may take longer than normal to heal. DIAGNOSIS Your health care provider may diagnose PVD from your signs and symptoms. The health care provider will also do a physical exam. You may have tests to find out what is causing your PVD and determine its severity. Tests may include:  Blood pressure recordings from your arms and legs and measurements of the strength of your pulses (pulse volume recordings).  Imaging studies using sound waves to take pictures of   the blood flow through your blood vessels (Doppler ultrasound).  Injecting a dye into your blood vessels before having imaging studies using:  X-rays (angiogram or arteriogram).  Computer-generated X-rays (CT angiogram).  A powerful electromagnetic field and a computer (magnetic resonance angiogram or MRA). TREATMENT Treatment for PVD depends on the cause of your condition and the severity of your symptoms. It also depends on your age. Underlying  causes need to be treated and controlled. These include long-lasting (chronic) conditions, such as diabetes, high cholesterol, and high blood pressure. You may need to first try making lifestyle changes and taking medicines. Surgery may be needed if these do not work. Lifestyle changes may include:  Quitting smoking.  Exercising regularly.  Following a low-fat, low-cholesterol diet. Medicines may include:  Blood thinners to prevent blood clots.  Medicines to improve blood flow.  Medicines to improve your blood cholesterol levels. Surgical procedures may include:  A procedure that uses an inflated balloon to open a blocked artery and improve blood flow (angioplasty).  A procedure to put in a tube (stent) to keep a blocked artery open (stent implant).  Surgery to reroute blood flow around a blocked artery (peripheral bypass surgery).  Surgery to remove dead tissue from an infected wound on the affected limb.  Amputation. This is surgical removal of the affected limb. This may be necessary in cases of acute ischemic limb that are not improved through medical or surgical treatments. HOME CARE INSTRUCTIONS  Take medicines only as directed by your health care provider.  Do not use any tobacco products, including cigarettes, chewing tobacco, or electronic cigarettes. If you need help quitting, ask your health care provider.  Lose weight if you are overweight, and maintain a healthy weight as directed by your health care provider.  Eat a diet that is low in fat and cholesterol. If you need help, ask your health care provider.  Exercise regularly. Ask your health care provider to suggest some good activities for you.  Use compression stockings or other mechanical devices as directed by your health care provider.  Take good care of your feet.  Wear comfortable shoes that fit well.  Check your feet often for any cuts or sores. SEEK MEDICAL CARE IF:  You have cramps in your legs  while walking.  You have leg pain when you are at rest.  You have coldness in a leg or foot.  Your skin changes.  You have erectile dysfunction.  You have cuts or sores on your feet that are not healing. SEEK IMMEDIATE MEDICAL CARE IF:  Your arm or leg turns cold and blue.  Your arms or legs become red, warm, swollen, painful, or numb.  You have chest pain or trouble breathing.  You suddenly have weakness in your face, arm, or leg.  You become very confused or lose the ability to speak.  You suddenly have a very bad headache or lose your vision.   This information is not intended to replace advice given to you by your health care provider. Make sure you discuss any questions you have with your health care provider.   Document Released: 03/03/2004 Document Revised: 02/14/2014 Document Reviewed: 07/04/2013 Elsevier Interactive Patient Education 2016 [employer]  

## 2015-05-21 NOTE — Progress Notes (Signed)
[facility] [place] HISTORY AND PHYSICAL -PAD  History of Present Illness Julia Goodman is a 82 y.o. female patient of Dr. Oneida Alar who is s/p right femoral endarterectomy with profundoplasty 04/24/2014. This was done with a vein patch. She is also s/p left CIA stenting. Her right foot feels better since the above revascularization. The dry gangrenous changes on her right foot are healed. She also complains of some pain in her left foot which is in the forefoot and toes. This is intermittent. She does not really describe classic rest pain. She does not describe numbness or tingling in her toes.   Review of systems: She develops dyspnea with minimal exertion. She is on 3 L of home oxygen. She denies chest pain. She is somewhat ambulatory but uses a motorized scooter frequently.   Pt denies any known history of stroke or TIA.  Pt Diabetic: Yes Pt smoker: former smoker, quit in 2001  Pt meds include: Statin :No Betablocker: Yes ASA: No Other anticoagulants/antiplatelets: Eliquis for hx of atrial fib  Past Medical History  Diagnosis Date  . Diabetes mellitus   . Leg pain   . COPD (chronic obstructive pulmonary disease) ([place])   . Cardiomyopathy   . Irregular heartbeat   . Coronary artery disease   . Myocardial infarction ([place])   . Peripheral vascular disease ([place])   . Cancer ([place])     Skin Melanoma  . CHF (congestive heart failure) ([place])   . Arthritis     OSTEOARTHRITIS  . Pacemaker     Social History Social History  Substance Use Topics  . Smoking status: Former Smoker -- 27 years    Types: Cigarettes    Quit date: 11/04/1999  . Smokeless tobacco: Never Used  . Alcohol Use: No    Family History Family History  Problem Relation Age of Onset  . Diabetes Other   . COPD Mother   . Cancer Father   . Diabetes Sister   . Hyperlipidemia Daughter   . Hypertension Daughter   . Hyperlipidemia Son   . Hypertension Son     Past Surgical History   Procedure Laterality Date  . Melanoma excision    . Finger surgery    . Aortogram with stenting  11-05-10    Left CIA stent  . Fracture surgery  Feb. 2013    Spine  . Cholecystectomy      Gall Bladder  . Abdominal aortagram N/A 04/21/2014    Procedure: ABDOMINAL Maxcine Ham;  Surgeon: Elam Dutch, MD;  Location: [facility] CATH LAB;  Service: Cardiovascular;  Laterality: N/A;  . Peripheral vascular catheterization  04/21/2014    Procedure: LOWER EXTREMITY ANGIOGRAPHY;  Surgeon: Elam Dutch, MD;  Location: [facility] CATH LAB;  Service: Cardiovascular;;  . Peripheral vascular catheterization  04/21/2014    Procedure: PERIPHERAL VASCULAR INTERVENTION;  Surgeon: Elam Dutch, MD;  Location: [facility] CATH LAB;  Service: Cardiovascular;;  right external iliac  . Endarterectomy femoral Right 04/24/2014    Procedure: RIGHT FEMORAL ARTERY ENDARTERECTOMY WITH PROFUNDOPLASTY USING VEIN;  Surgeon: Elam Dutch, MD;  Location: [facility];  Service: Vascular;  Laterality: Right;  . Pacemaker insertion Left 05-31-14    Medtronic Model # [id number], Model# [id number], Model # [id number]    Allergies  Allergen Reactions  . Hydrocodone Anaphylaxis  . Ace Inhibitors Other (See Comments)  . Atorvastatin Nausea And Vomiting  . Ciprofloxacin   . Ketamine Other (See Comments)    halluciantion  . Niaspan [Niacin Er]  Other (See Comments)    jitters  . Zetia [Ezetimibe]     Current Outpatient Prescriptions  Medication Sig Dispense Refill  . acetaminophen-codeine (TYLENOL #3) 300-30 MG per tablet Take 1 tablet by mouth every 4 (four) hours as needed (pain).     Marland Kitchen albuterol (PROVENTIL HFA;VENTOLIN HFA) 108 (90 BASE) MCG/ACT inhaler Inhale into the lungs every 6 (six) hours as needed for wheezing or shortness of breath.    Marland Kitchen amitriptyline (ELAVIL) 25 MG tablet Take 25 mg by mouth at bedtime.    Marland Kitchen apixaban (ELIQUIS) 2.5 MG TABS tablet Take 2.5 mg by mouth 2 (two) times daily.    . Calcium Carbonate-Vitamin D (OSCAL 500/200 D-3  PO) Take 1 tablet by mouth daily with supper.     . diltiazem (CARDIZEM) 120 MG tablet Take 120 mg by mouth 3 (three) times daily.    Marland Kitchen donepezil (ARICEPT) 10 MG tablet Take 10 mg by mouth at bedtime.    Marland Kitchen escitalopram (LEXAPRO) 10 MG tablet Take 5 mg by mouth 2 (two) times daily.    . fenofibrate 160 MG tablet Take 160 mg by mouth every morning.     . ferrous sulfate 325 (65 FE) MG tablet Take 325 mg by mouth daily with breakfast.    . fexofenadine (ALLEGRA) 180 MG tablet Take 180 mg by mouth daily.    . fish oil-omega-3 fatty acids 1000 MG capsule Take 1 g by mouth every morning.     . fluticasone (FLONASE) 50 MCG/ACT nasal spray Place 1 spray into both nostrils daily as needed for allergies.     Marland Kitchen FOLIC ACID PO Take 1 tablet by mouth every morning.    . Ginkgo Biloba (GINKGO PO) Take 1 tablet by mouth daily with supper.     Marland Kitchen glipiZIDE (GLUCOTROL) 5 MG tablet Take 5 mg by mouth 2 (two) times daily before a meal.     . GLUCOSAMINE-CHONDROITIN PO Take 1 tablet by mouth daily with supper.    Marland Kitchen ipratropium-albuterol (DUONEB) 0.5-2.5 (3) MG/3ML SOLN Take 3 mLs by nebulization 2 (two) times daily as needed (shortness of breath).    . metFORMIN (GLUCOPHAGE) 500 MG tablet Take 500 mg by mouth daily with breakfast.    . Multiple Vitamin (MULTIVITAMIN WITH MINERALS) TABS tablet Take 0.5 tablets by mouth 2 (two) times daily.    . nitroGLYCERIN (NITRODUR - DOSED IN MG/24 HR) 0.4 mg/hr patch Place 0.4 mg onto the skin every morning.    Marland Kitchen omeprazole (PRILOSEC) 20 MG capsule Take 20 mg by mouth daily.    . pantoprazole (PROTONIX) 40 MG tablet Take 40 mg by mouth daily.    . pravastatin (PRAVACHOL) 80 MG tablet Take 80 mg by mouth every evening.     . sitaGLIPtin (JANUVIA) 100 MG tablet Take 100 mg by mouth every morning.     . sotalol (BETAPACE) 80 MG tablet Take 80 mg by mouth 2 (two) times daily.    . traMADol (ULTRAM) 50 MG tablet Take 1 tablet (50 mg total) by mouth every 6 (six) hours as needed for  moderate pain. 30 tablet 0  . triamcinolone (NASACORT AQ) 55 MCG/ACT AERO nasal inhaler Place 2 sprays into the nose daily.    . Vitamin D, Ergocalciferol, (DRISDOL) 50000 UNITS CAPS capsule Take 50,000 Units by mouth See admin instructions. 5 times monthly    . Wound Dressings (ALLANTOIN) gel Apply topically as needed for wound care. 15 g 10   No current facility-administered medications for  this visit.    ROS: See HPI for pertinent positives and negatives.   Physical Examination  Filed Vitals:   05/21/15 1146  BP: 140/80  Pulse: 80  Temp: 97 F (36.1 C)  TempSrc: Oral  Resp: 22  Height: 5\' 4"  (1.626 m)  Weight: 208 lb (94.348 kg)  SpO2: 89%   Body mass index is 35.69 kg/(m^2).  General: A&O x 3, WDWN, obese female. Gait: using motorized w/c Eyes: PERRLA. Pulmonary: Respirations are mildly labored at rest, using supplemental portable oxygen, decreased air movement in posterior fields with a few rales. Cardiac: regular rhythm, no detected murmur. Pacemaker palpated subcutaneously left upper chest.       Carotid Bruits Right Left   Negative Negative  Aorta is not palpable. Radial pulses: 2+ palpable                           VASCULAR EXAM: Extremities without ischemic changes, without Gangrene; without open wounds. There is a mild callus on a bony prominence on the lateral aspect of her left foot where her daughter states her shoe rubs.                                                                                                          LE Pulses Right Left       FEMORAL  palpable  not palpable        POPLITEAL  not palpable   not palpable       POSTERIOR TIBIAL  not palpable   not palpable        DORSALIS PEDIS      ANTERIOR TIBIAL not palpable  not palpable    Abdomen: softly obese, NT, no palpable masses. Skin: no rashes, no ulcers Musculoskeletal: Generalized mild deconditioning muscle wasting or atrophy.  Neurologic: A&O X 3; Appropriate Affect ;  SENSATION: normal; MOTOR FUNCTION:  moving all extremities equally, motor strength 4/5 throughout. Speech is fluent/normal.  CN 2-12 grossly intact except is hard of hearing.    Non-Invasive Vascular Imaging: DATE: 05/21/2015 ABI: RIGHT: 0.96 (1.01), Waveforms: tri and biphasic, TBI: 0.74;  LEFT: 0.55 (0.55), Waveforms: monophasic, TBI: 0.33, toe pressure of 53   ASSESSMENT: EVANIA PEAN is a 82 y.o. female who is s/p right femoral endarterectomy with profundoplasty 04/24/2014. This was done with a vein patch. She does not walk enough to elicit claudication symptoms. She is somewhat ambulatory but uses a motorized wheelchair frequently.  She has a mild callus at the lateral aspect of her left foot at a bony prominence where her daughter states her shoe rubs. Pt states she has a pair of diabetic shoes that hurt her feet to wear. She has no signs of ischemia in her feet/legs.  She develops dyspnea with minimal exertion.  Today's ABI's indicate normal arterial perfusion in the right leg with tri and biphasic waveforms, and stable moderate arterial occlusive disease in the left leg with monophasic waveforms.  Dr. Oneida Alar spoke with pt and daughter and examined pt.  Offered  to refer pt to [employer] for fitting of comfortable and protective shoes; pt and daughter state they will call us if they decide to do this.    PLAN:  Daily seated leg exercises discussed with pt and daughter and demonstrated.  Based on the patient's vascular studies and examination, pt will return to clinic in 6 months with ABI's, see NP on a day that Dr. Oneida Alar is in the office.   I discussed in depth with the patient the nature of atherosclerosis, and emphasized the importance of maximal medical management including strict control of blood pressure, blood glucose, and lipid levels, obtaining regular exercise, and continued cessation of smoking.  The patient is aware that without maximal medical management the underlying  atherosclerotic disease process will progress, limiting the benefit of any interventions.  The patient was given information about PAD including signs, symptoms, treatment, what symptoms should prompt the patient to seek immediate medical care, and risk reduction measures to take.  Clemon Chambers, RN, MSN, FNP-C [facility] Phone: [phone]  Clinic MD: [facility]  05/21/2015 11:56 AM

## 2015-05-26 IMAGING — CR DG CHEST 2V
2 series · 2 of 2 positions shown · non-contrast
Comparison: None.

CLINICAL DATA: Dyspnea

EXAM:
CHEST  2 VIEW

[chest lat]
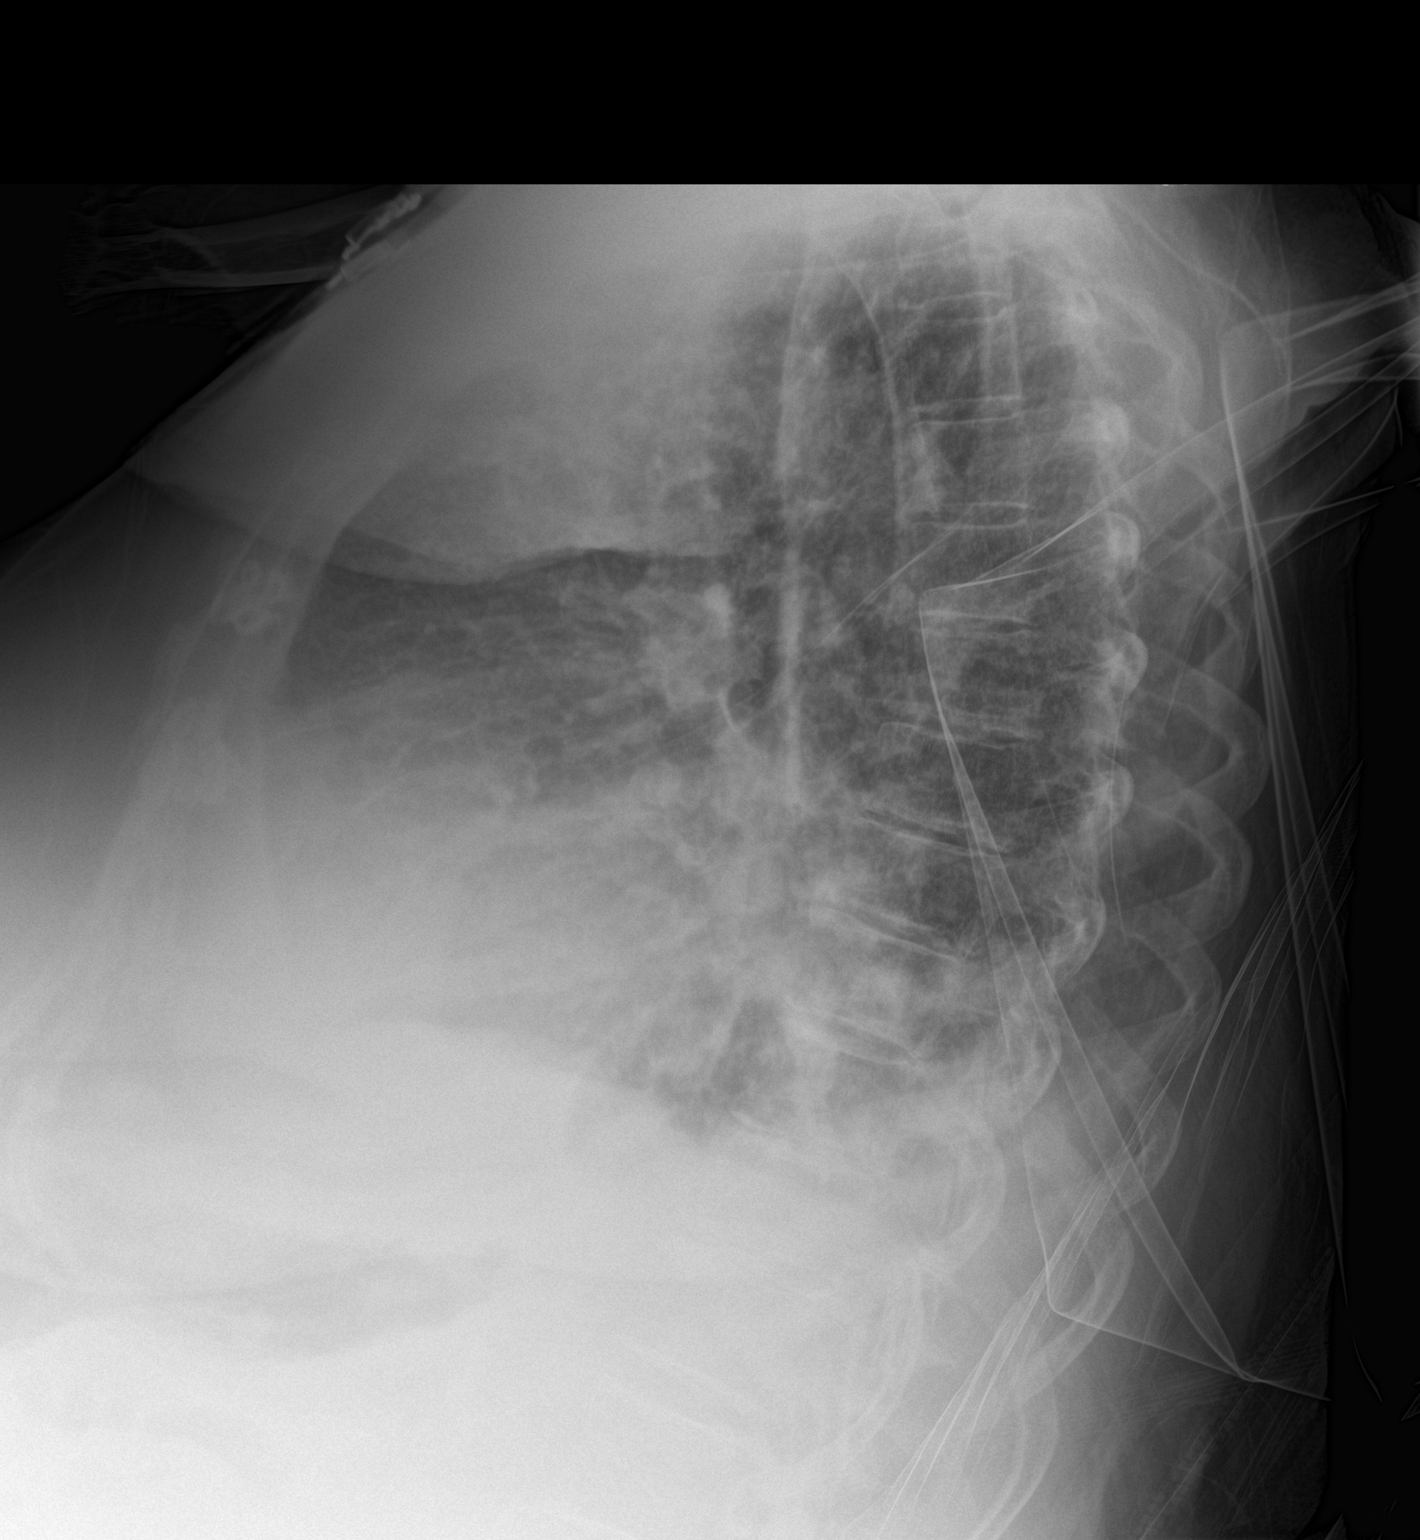

[chest ap]
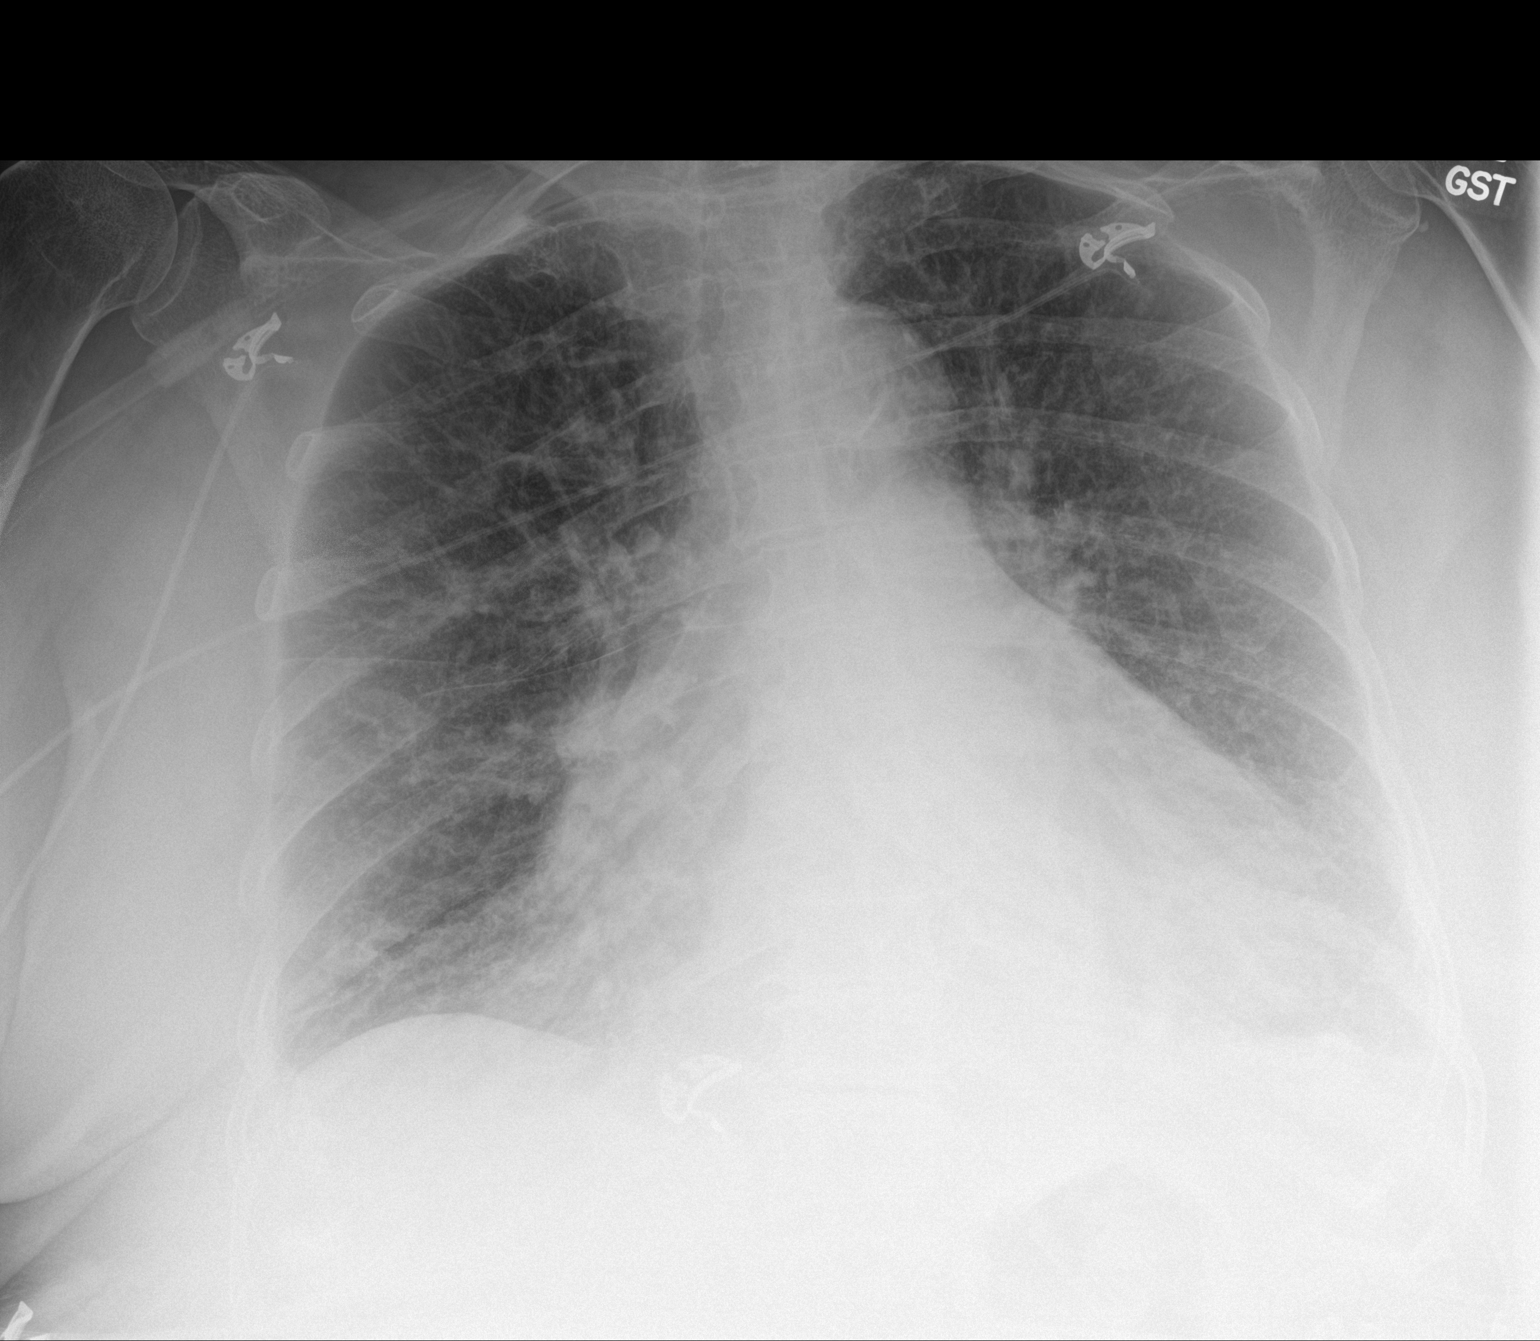

[2 of 2 positions shown; findings below may reference images not displayed]

FINDINGS: There is marked cardiomegaly. Vascular and interstitial congestive
changes are present. Mild basilar airspace opacities are present.
The findings likely represent congestive heart failure. There
probably are small effusions collected in the posterior costophrenic
angles. No large effusion is evident.
IMPRESSION: Cardiomegaly.  Congestive heart failure.

## 2015-05-31 IMAGING — CR DG CHEST 1V PORT
1 series · 1 of 1 positions shown · non-contrast
Comparison: Chest radiograph performed 04/22/2014

CLINICAL DATA: Acute onset of cough.  Subsequent encounter.

EXAM:
PORTABLE CHEST - 1 VIEW

[AP]
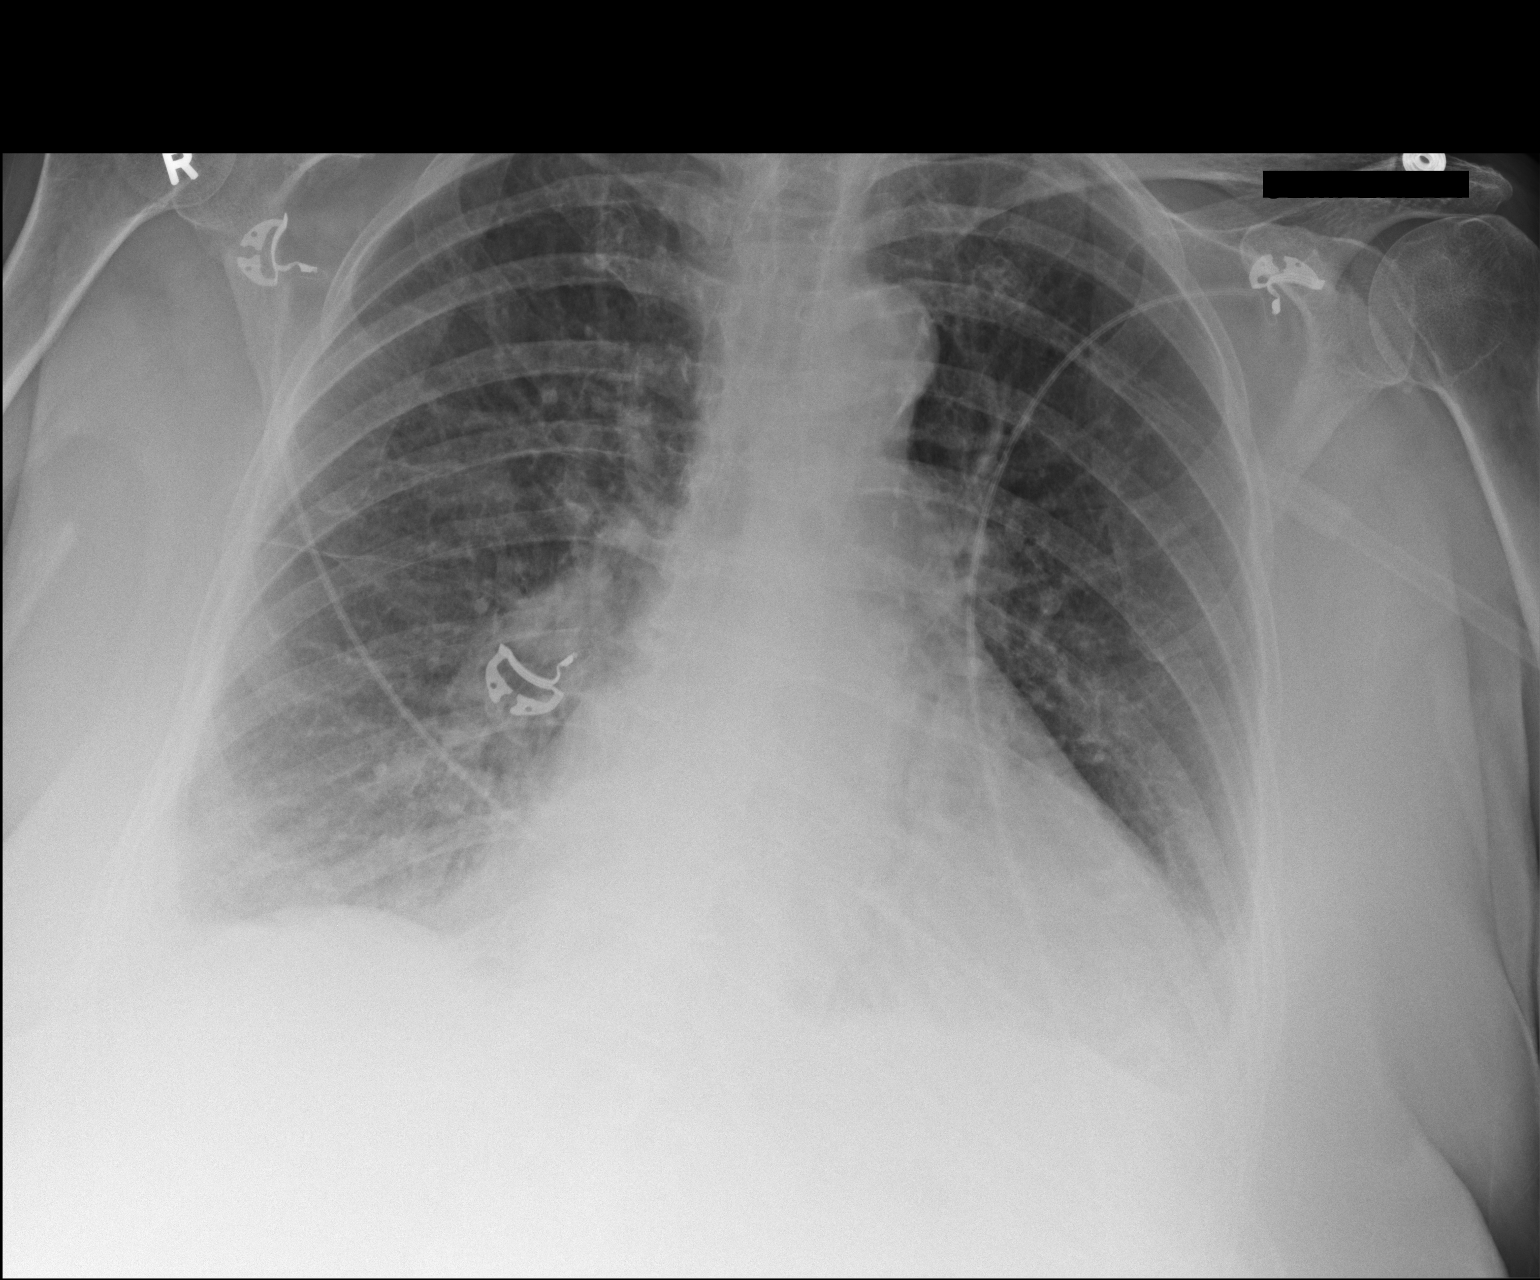

[1 of 1 positions shown; findings below may reference images not displayed]

FINDINGS: The lungs are well-aerated. Vascular congestion is again noted, with
mild bibasilar opacities and small bilateral pleural effusions. This
is similar in appearance to the prior study, though effusions are
slightly larger. No pneumothorax is identified.

The cardiomediastinal silhouette is borderline normal in size. No
acute osseous abnormalities are seen.
IMPRESSION: Vascular congestion again noted, with mild bibasilar opacities and
small bilateral pleural effusions. This is compatible with pulmonary
edema and is relatively similar to the prior study, though effusions
are slightly larger.

## 2015-06-15 ENCOUNTER — Other Ambulatory Visit: Payer: Self-pay | Admitting: *Deleted

## 2015-06-15 DIAGNOSIS — I739 Peripheral vascular disease, unspecified: Secondary | ICD-10-CM

## 2015-12-16 ENCOUNTER — Encounter: Payer: Self-pay | Admitting: Family

## 2015-12-17 ENCOUNTER — Ambulatory Visit (HOSPITAL_COMMUNITY)
Admission: RE | Admit: 2015-12-17 | Discharge: 2015-12-17 | Disposition: A | Discharge status: Home / Self Care | Payer: Medicare Other | Source: Ambulatory Visit | Attending: Family | Admitting: Family

## 2015-12-17 ENCOUNTER — Ambulatory Visit (INDEPENDENT_AMBULATORY_CARE_PROVIDER_SITE_OTHER): Payer: Medicare Other | Admitting: Family

## 2015-12-17 ENCOUNTER — Encounter: Payer: Self-pay | Admitting: Family

## 2015-12-17 VITALS — BP 136/79 | HR 77 | Temp 97.0°F

## 2015-12-17 DIAGNOSIS — R938 Abnormal findings on diagnostic imaging of other specified body structures: Secondary | ICD-10-CM | POA: Diagnosis not present

## 2015-12-17 DIAGNOSIS — I739 Peripheral vascular disease, unspecified: Secondary | ICD-10-CM | POA: Diagnosis not present

## 2015-12-17 DIAGNOSIS — I10 Essential (primary) hypertension: Secondary | ICD-10-CM | POA: Insufficient documentation

## 2015-12-17 DIAGNOSIS — Z87891 Personal history of nicotine dependence: Secondary | ICD-10-CM

## 2015-12-17 DIAGNOSIS — E1151 Type 2 diabetes mellitus with diabetic peripheral angiopathy without gangrene: Secondary | ICD-10-CM | POA: Diagnosis not present

## 2015-12-17 DIAGNOSIS — Z9889 Other specified postprocedural states: Secondary | ICD-10-CM

## 2015-12-17 DIAGNOSIS — Z95828 Presence of other vascular implants and grafts: Secondary | ICD-10-CM

## 2015-12-17 DIAGNOSIS — R0989 Other specified symptoms and signs involving the circulatory and respiratory systems: Secondary | ICD-10-CM | POA: Diagnosis present

## 2015-12-17 NOTE — Patient Instructions (Signed)
Peripheral Vascular Disease Peripheral vascular disease (PVD) is a disease of the blood vessels that are not part of your heart and brain. A simple term for PVD is poor circulation. In most cases, PVD narrows the blood vessels that carry blood from your heart to the rest of your body. This can result in a decreased supply of blood to your arms, legs, and internal organs, like your stomach or kidneys. However, it most often affects a person's lower legs and feet. There are two types of PVD.  Organic PVD. This is the more common type. It is caused by damage to the structure of blood vessels.  Functional PVD. This is caused by conditions that make blood vessels contract and tighten (spasm). Without treatment, PVD tends to get worse over time. PVD can also lead to acute ischemic limb. This is when an arm or limb suddenly has trouble getting enough blood. This is a medical emergency. CAUSES Each type of PVD has many different causes. The most common cause of PVD is buildup of a fatty material (plaque) inside of your arteries (atherosclerosis). Small amounts of plaque can break off from the walls of the blood vessels and become lodged in a smaller artery. This blocks blood flow and can cause acute ischemic limb. Other common causes of PVD include:  Blood clots that form inside of blood vessels.  Injuries to blood vessels.  Diseases that cause inflammation of blood vessels or cause blood vessel spasms.  Health behaviors and health history that increase your risk of developing PVD. RISK FACTORS  You may have a greater risk of PVD if you:  Have a family history of PVD.  Have certain medical conditions, including:  High cholesterol.  Diabetes.  High blood pressure (hypertension).  Coronary heart disease.  Past problems with blood clots.  Past injury, such as burns or a broken bone. These may have damaged blood vessels in your limbs.  Buerger disease. This is caused by inflamed blood  vessels in your hands and feet.  Some forms of arthritis.  Rare birth defects that affect the arteries in your legs.  Use tobacco.  Do not get enough exercise.  Are obese.  Are age 50 or older. SIGNS AND SYMPTOMS  PVD may cause many different symptoms. Your symptoms depend on what part of your body is not getting enough blood. Some common signs and symptoms include:  Cramps in your lower legs. This may be a symptom of poor leg circulation (claudication).  Pain and weakness in your legs while you are physically active that goes away when you rest (intermittent claudication).  Leg pain when at rest.  Leg numbness, tingling, or weakness.  Coldness in a leg or foot, especially when compared with the other leg.  Skin or hair changes. These can include:  Hair loss.  Shiny skin.  Pale or bluish skin.  Thick toenails.  Inability to get or maintain an erection (erectile dysfunction). People with PVD are more prone to developing ulcers and sores on their toes, feet, or legs. These may take longer than normal to heal. DIAGNOSIS Your health care provider may diagnose PVD from your signs and symptoms. The health care provider will also do a physical exam. You may have tests to find out what is causing your PVD and determine its severity. Tests may include:  Blood pressure recordings from your arms and legs and measurements of the strength of your pulses (pulse volume recordings).  Imaging studies using sound waves to take pictures of   the blood flow through your blood vessels (Doppler ultrasound).  Injecting a dye into your blood vessels before having imaging studies using:  X-rays (angiogram or arteriogram).  Computer-generated X-rays (CT angiogram).  A powerful electromagnetic field and a computer (magnetic resonance angiogram or MRA). TREATMENT Treatment for PVD depends on the cause of your condition and the severity of your symptoms. It also depends on your age. Underlying  causes need to be treated and controlled. These include long-lasting (chronic) conditions, such as diabetes, high cholesterol, and high blood pressure. You may need to first try making lifestyle changes and taking medicines. Surgery may be needed if these do not work. Lifestyle changes may include:  Quitting smoking.  Exercising regularly.  Following a low-fat, low-cholesterol diet. Medicines may include:  Blood thinners to prevent blood clots.  Medicines to improve blood flow.  Medicines to improve your blood cholesterol levels. Surgical procedures may include:  A procedure that uses an inflated balloon to open a blocked artery and improve blood flow (angioplasty).  A procedure to put in a tube (stent) to keep a blocked artery open (stent implant).  Surgery to reroute blood flow around a blocked artery (peripheral bypass surgery).  Surgery to remove dead tissue from an infected wound on the affected limb.  Amputation. This is surgical removal of the affected limb. This may be necessary in cases of acute ischemic limb that are not improved through medical or surgical treatments. HOME CARE INSTRUCTIONS  Take medicines only as directed by your health care provider.  Do not use any tobacco products, including cigarettes, chewing tobacco, or electronic cigarettes. If you need help quitting, ask your health care provider.  Lose weight if you are overweight, and maintain a healthy weight as directed by your health care provider.  Eat a diet that is low in fat and cholesterol. If you need help, ask your health care provider.  Exercise regularly. Ask your health care provider to suggest some good activities for you.  Use compression stockings or other mechanical devices as directed by your health care provider.  Take good care of your feet.  Wear comfortable shoes that fit well.  Check your feet often for any cuts or sores. SEEK MEDICAL CARE IF:  You have cramps in your legs  while walking.  You have leg pain when you are at rest.  You have coldness in a leg or foot.  Your skin changes.  You have erectile dysfunction.  You have cuts or sores on your feet that are not healing. SEEK IMMEDIATE MEDICAL CARE IF:  Your arm or leg turns cold and blue.  Your arms or legs become red, warm, swollen, painful, or numb.  You have chest pain or trouble breathing.  You suddenly have weakness in your face, arm, or leg.  You become very confused or lose the ability to speak.  You suddenly have a very bad headache or lose your vision.   This information is not intended to replace advice given to you by your health care provider. Make sure you discuss any questions you have with your health care provider.   Document Released: 03/03/2004 Document Revised: 02/14/2014 Document Reviewed: 07/04/2013 Elsevier Interactive Patient Education 2016 [employer]  

## 2015-12-17 NOTE — Progress Notes (Signed)
[facility] [place]   CC: Follow up peripheral artery occlusive disease  History of Present Illness Julia Goodman is a 82 y.o. female patient of Dr. Oneida Alar who is s/p right femoral endarterectomy with profundoplasty on 04/24/2014. This was done with a vein patch. She has had some difficulty healing of the upper portion of her right groin incision.  She is also s/p left CIA stent placement in 2012. The dry gangrenous changes on her right foot have healed.   She was hospitalized in August 2017 for exacerbation of CHF. She has been weak since then. She also has COPD. She is receiving home physical therapy.   She develops dyspnea with minimal exertion. She is on 3 L of home oxygen. She denies chest pain. She is somewhat ambulatory but uses a motorized scooter frequently.   Pt Diabetic: Yes, last A1C result on file was 6.3 on 04/17/14 Pt smoker: former smoker, quit in 2001, smoked x 55 years  Pt meds include: Statin :Yes Betablocker: Yes ASA: No Other anticoagulants/antiplatelets: Eliquis  Past Medical History:  Diagnosis Date  . Arthritis    OSTEOARTHRITIS  . Cancer ([place])    Skin Melanoma  . Cardiomyopathy   . CHF (congestive heart failure) ([place])   . COPD (chronic obstructive pulmonary disease) ([place])   . Coronary artery disease   . Diabetes mellitus   . Irregular heartbeat   . Leg pain   . Myocardial infarction   . Pacemaker   . Peripheral vascular disease [facility])     Social History Social History  Substance Use Topics  . Smoking status: Former Smoker    Years: 55.00    Types: Cigarettes    Quit date: 11/04/1999  . Smokeless tobacco: Never Used  . Alcohol use No    Family History Family History  Problem Relation Age of Onset  . Diabetes Other   . COPD Mother   . Cancer Father   . Diabetes Sister   . Hyperlipidemia Daughter   . Hypertension Daughter   . Hyperlipidemia Son   . Hypertension Son     Past Surgical History:  Procedure  Laterality Date  . ABDOMINAL AORTAGRAM N/A 04/21/2014   Procedure: ABDOMINAL Maxcine Ham;  Surgeon: Elam Dutch, MD;  Location: [facility] CATH LAB;  Service: Cardiovascular;  Laterality: N/A;  . Aortogram with stenting  11-05-10   Left CIA stent  . CHOLECYSTECTOMY     Gall Bladder  . ENDARTERECTOMY FEMORAL Right 04/24/2014   Procedure: RIGHT FEMORAL ARTERY ENDARTERECTOMY WITH PROFUNDOPLASTY USING VEIN;  Surgeon: Elam Dutch, MD;  Location: [facility];  Service: Vascular;  Laterality: Right;  . FINGER SURGERY    . FRACTURE SURGERY  Feb. 2013   Spine  . MELANOMA EXCISION    . PACEMAKER INSERTION Left 05-31-14   Medtronic Model # [id number], Model# [id number], Model # [id number]  . PERIPHERAL VASCULAR CATHETERIZATION  04/21/2014   Procedure: LOWER EXTREMITY ANGIOGRAPHY;  Surgeon: Elam Dutch, MD;  Location: [facility] CATH LAB;  Service: Cardiovascular;;  . PERIPHERAL VASCULAR CATHETERIZATION  04/21/2014   Procedure: PERIPHERAL VASCULAR INTERVENTION;  Surgeon: Elam Dutch, MD;  Location: [facility] CATH LAB;  Service: Cardiovascular;;  right external iliac    Allergies  Allergen Reactions  . Hydrocodone Anaphylaxis  . Ace Inhibitors Other (See Comments)  . Atorvastatin Nausea And Vomiting  . Ciprofloxacin   . Ketamine Other (See Comments)    halluciantion  . Niaspan [Niacin Er] Other (See Comments)    jitters  .  Zetia [Ezetimibe]     Current Outpatient Prescriptions  Medication Sig Dispense Refill  . acetaminophen-codeine (TYLENOL #3) 300-30 MG per tablet Take 1 tablet by mouth every 4 (four) hours as needed (pain).     Marland Kitchen albuterol (PROVENTIL HFA;VENTOLIN HFA) 108 (90 BASE) MCG/ACT inhaler Inhale into the lungs every 6 (six) hours as needed for wheezing or shortness of breath.    Marland Kitchen amitriptyline (ELAVIL) 25 MG tablet Take 25 mg by mouth at bedtime.    Marland Kitchen apixaban (ELIQUIS) 2.5 MG TABS tablet Take 2.5 mg by mouth 2 (two) times daily.    . Calcium Carb-Cholecalciferol (OS-CAL CALCIUM + D3 PO) Take by mouth  every evening.    . Calcium Carbonate-Vitamin D (OSCAL 500/200 D-3 PO) Take 1 tablet by mouth daily with supper.     . diclofenac sodium (VOLTAREN) 1 % GEL Apply topically as needed.    . diltiazem (CARDIZEM) 120 MG tablet Take 120 mg by mouth 3 (three) times daily.    Marland Kitchen donepezil (ARICEPT) 10 MG tablet Take 10 mg by mouth at bedtime.    Marland Kitchen escitalopram (LEXAPRO) 10 MG tablet Take 5 mg by mouth 2 (two) times daily.    . fenofibrate 160 MG tablet Take 160 mg by mouth every morning.     . ferrous sulfate 325 (65 FE) MG tablet Take 325 mg by mouth daily with breakfast.    . fexofenadine (ALLEGRA) 180 MG tablet Take 180 mg by mouth daily.    . fish oil-omega-3 fatty acids 1000 MG capsule Take 1 g by mouth every morning.     . fluticasone (FLONASE) 50 MCG/ACT nasal spray Place 1 spray into both nostrils daily as needed for allergies.     Marland Kitchen FOLIC ACID PO Take 1 tablet by mouth every morning.    . Ginkgo Biloba (GINKGO PO) Take 1 tablet by mouth daily with supper.     Marland Kitchen glipiZIDE (GLUCOTROL) 5 MG tablet Take 5 mg by mouth 2 (two) times daily before a meal.     . GLUCOSAMINE-CHONDROITIN PO Take 1 tablet by mouth daily with supper.    Marland Kitchen ipratropium-albuterol (DUONEB) 0.5-2.5 (3) MG/3ML SOLN Take 3 mLs by nebulization 2 (two) times daily as needed (shortness of breath).    . metFORMIN (GLUCOPHAGE) 500 MG tablet Take 500 mg by mouth daily with breakfast.    . Multiple Vitamin (MULTIVITAMIN WITH MINERALS) TABS tablet Take 0.5 tablets by mouth 2 (two) times daily.    . nitroGLYCERIN (NITRODUR - DOSED IN MG/24 HR) 0.4 mg/hr patch Place 0.4 mg onto the skin every morning.    Marland Kitchen omeprazole (PRILOSEC) 20 MG capsule Take 20 mg by mouth daily.    . pantoprazole (PROTONIX) 40 MG tablet Take 40 mg by mouth daily.    . pravastatin (PRAVACHOL) 80 MG tablet Take 80 mg by mouth every evening.     Marland Kitchen Propylene Glycol (SYSTANE BALANCE OP) Apply 1 drop to eye daily. 1 drop each eye am & pm    . sitaGLIPtin (JANUVIA) 100 MG  tablet Take 100 mg by mouth every morning.     . sotalol (BETAPACE) 80 MG tablet Take 80 mg by mouth 2 (two) times daily.    . traMADol (ULTRAM) 50 MG tablet Take 1 tablet (50 mg total) by mouth every 6 (six) hours as needed for moderate pain. 30 tablet 0  . triamcinolone (NASACORT AQ) 55 MCG/ACT AERO nasal inhaler Place 2 sprays into the nose daily.    . Vitamin  D, Ergocalciferol, (DRISDOL) 50000 UNITS CAPS capsule Take 50,000 Units by mouth See admin instructions. 5 times monthly    . Wound Dressings (ALLANTOIN) gel Apply topically as needed for wound care. 15 g 10   No current facility-administered medications for this visit.     ROS: See HPI for pertinent positives and negatives.   Physical Examination  Vitals:   12/17/15 1411 12/17/15 1415  BP: 138/75 136/79  Pulse: 74 77  Temp: 97 F (36.1 C)   SpO2: 93%    There is no height or weight on file to calculate BMI.  General: A&O x 3, WDWN, obese female seated in her motorized w/c. Gait: seated in her motorized w/c Eyes: PERRLA. Pulmonary: Respirations are slightly labored at rest, CTAB, good air movement. Supplemental O2 via [place] in place Cardiac: regular rhythm, no detected murmur. Pacemaker palpated left upper chest.     Carotid Bruits Right Left   Negative Negative  Aorta is not palpable. Radial pulses: 1+ palpable and =                           VASCULAR EXAM: Extremities without ischemic changes, without Gangrene; without open wounds. 1+ pitting edema in lower legs                                                                                                           LE Pulses Right Left       FEMORAL  not palpable seated in w/c  not palpable        POPLITEAL  not palpable   not palpable       POSTERIOR TIBIAL  not palpable   not palpable        DORSALIS PEDIS      ANTERIOR TIBIAL faintly palpable  not palpable    Abdomen: soft, NT, no palpable masses. Skin: no rashes, no ulcers. Musculoskeletal: no  muscle wasting or atrophy.  Neurologic: A&O X 3; Appropriate Affect ; SENSATION: normal; MOTOR FUNCTION:  moving all extremities equally, motor strength 4/5 throughout. Speech is fluent/normal.  CN 2-12 intact.    ASSESSMENT: Julia Goodman is a 82 y.o. female who is s/p right femoral endarterectomy with profundoplasty on 04/24/2014. She is also s/p left CIA stent placement in 2012. She is too weak to walk much since she was hospitalized in August 2017 with exacerbation of CHF. She is receiving home physical therapy which she has found to be beneficial.  There are no signs of ischemia in her feet/legs.   DATA Right ABI has remained stable, 0.96 today, was 0.96 on 05/21/15, waveforms have declined from tri and biphasic to bi and monophasic. TBI is normal at 0.73 (0.74 previous). Left ABI has improved slightly from 0.55 to 0.64, waveforms remain monophasic, TBI improved from 0.33 to 0.41  PLAN:  I encouraged pt to perform the daily exercises advised to her by physical therapy. Based on the patient's vascular studies and examination, pt will return to clinic in 6 months with ABI's.  I discussed in depth with the patient the nature of atherosclerosis, and emphasized the importance of maximal medical management including strict control of blood pressure, blood glucose, and lipid levels, obtaining regular exercise, and continued cessation of smoking.  The patient is aware that without maximal medical management the underlying atherosclerotic disease process will progress, limiting the benefit of any interventions.  The patient was given information about PAD including signs, symptoms, treatment, what symptoms should prompt the patient to seek immediate medical care, and risk reduction measures to take.  Clemon Chambers, RN, MSN, FNP-C [facility] Phone: [phone]  Clinic MD: Oneida Alar  12/17/15 2:28 PM

## 2015-12-22 NOTE — Addendum Note (Signed)
Addended by: Thresa Ross C on: 12/22/2015 09:30 AM   Modules accepted: Orders

## 2016-06-23 ENCOUNTER — Ambulatory Visit: Payer: Medicare Other | Admitting: Vascular Surgery

## 2016-06-23 ENCOUNTER — Encounter (HOSPITAL_COMMUNITY): Payer: Medicare Other

## 2016-07-28 ENCOUNTER — Encounter: Payer: Self-pay | Admitting: Vascular Surgery

## 2016-08-11 ENCOUNTER — Ambulatory Visit: Payer: Medicare Other | Admitting: Vascular Surgery

## 2016-08-11 ENCOUNTER — Encounter (HOSPITAL_COMMUNITY): Payer: Medicare Other

## 2016-09-20 ENCOUNTER — Encounter: Payer: Self-pay | Admitting: Vascular Surgery

## 2016-09-29 ENCOUNTER — Ambulatory Visit: Payer: Medicare Other | Admitting: Vascular Surgery

## 2016-09-29 ENCOUNTER — Encounter (HOSPITAL_COMMUNITY): Payer: Medicare Other

## 2017-06-07 DEATH — deceased
